# Patient Record
Sex: Female | Born: 1955 | Race: Black or African American | Hispanic: No | Marital: Married | Smoking: Former smoker
Health system: Southern US, Community
[De-identification: ages and names within clinical notes are randomized; demographics above are authoritative.]

## PROBLEM LIST (undated history)

## (undated) DIAGNOSIS — R4702 Dysphasia: Secondary | ICD-10-CM

## (undated) DIAGNOSIS — J449 Chronic obstructive pulmonary disease, unspecified: Secondary | ICD-10-CM

## (undated) DIAGNOSIS — R0609 Other forms of dyspnea: Secondary | ICD-10-CM

## (undated) DIAGNOSIS — M75101 Unspecified rotator cuff tear or rupture of right shoulder, not specified as traumatic: Secondary | ICD-10-CM

## (undated) DIAGNOSIS — J439 Emphysema, unspecified: Secondary | ICD-10-CM

## (undated) DIAGNOSIS — J40 Bronchitis, not specified as acute or chronic: Secondary | ICD-10-CM

## (undated) DIAGNOSIS — Z8673 Personal history of transient ischemic attack (TIA), and cerebral infarction without residual deficits: Secondary | ICD-10-CM

## (undated) DIAGNOSIS — R7303 Prediabetes: Secondary | ICD-10-CM

## (undated) DIAGNOSIS — T8859XA Other complications of anesthesia, initial encounter: Secondary | ICD-10-CM

## (undated) DIAGNOSIS — I10 Essential (primary) hypertension: Secondary | ICD-10-CM

## (undated) DIAGNOSIS — J45909 Unspecified asthma, uncomplicated: Secondary | ICD-10-CM

## (undated) DIAGNOSIS — N950 Postmenopausal bleeding: Secondary | ICD-10-CM

## (undated) DIAGNOSIS — H40003 Preglaucoma, unspecified, bilateral: Secondary | ICD-10-CM

## (undated) DIAGNOSIS — N840 Polyp of corpus uteri: Secondary | ICD-10-CM

## (undated) DIAGNOSIS — I639 Cerebral infarction, unspecified: Secondary | ICD-10-CM

## (undated) DIAGNOSIS — Z973 Presence of spectacles and contact lenses: Secondary | ICD-10-CM

## (undated) DIAGNOSIS — M1990 Unspecified osteoarthritis, unspecified site: Secondary | ICD-10-CM

## (undated) DIAGNOSIS — K219 Gastro-esophageal reflux disease without esophagitis: Secondary | ICD-10-CM

## (undated) HISTORY — DX: Bronchitis, not specified as acute or chronic: J40

## (undated) HISTORY — PX: GALLBLADDER SURGERY: SHX652

## (undated) HISTORY — DX: Emphysema, unspecified: J43.9

## (undated) HISTORY — DX: Chronic obstructive pulmonary disease, unspecified: J44.9

---

## 1978-04-07 HISTORY — PX: DILATION AND CURETTAGE OF UTERUS: SHX78

## 1990-04-07 HISTORY — PX: TUBAL LIGATION: SHX77

## 1994-04-07 HISTORY — PX: CHOLECYSTECTOMY: SHX55

## 1999-01-25 ENCOUNTER — Other Ambulatory Visit: Admission: RE | Admit: 1999-01-25 | Discharge: 1999-01-25 | Payer: Self-pay | Admitting: Gynecology

## 2000-02-26 ENCOUNTER — Ambulatory Visit (HOSPITAL_COMMUNITY): Admission: RE | Admit: 2000-02-26 | Discharge: 2000-02-26 | Payer: Self-pay | Admitting: Family Medicine

## 2000-05-05 ENCOUNTER — Other Ambulatory Visit: Admission: RE | Admit: 2000-05-05 | Discharge: 2000-05-05 | Payer: Self-pay | Admitting: Gynecology

## 2000-10-02 ENCOUNTER — Ambulatory Visit (HOSPITAL_COMMUNITY): Admission: RE | Admit: 2000-10-02 | Discharge: 2000-10-02 | Payer: Self-pay

## 2002-04-14 ENCOUNTER — Other Ambulatory Visit: Admission: RE | Admit: 2002-04-14 | Discharge: 2002-04-14 | Payer: Self-pay | Admitting: Gynecology

## 2004-03-21 ENCOUNTER — Other Ambulatory Visit: Admission: RE | Admit: 2004-03-21 | Discharge: 2004-03-21 | Payer: Self-pay | Admitting: Obstetrics and Gynecology

## 2005-03-26 ENCOUNTER — Other Ambulatory Visit: Admission: RE | Admit: 2005-03-26 | Discharge: 2005-03-26 | Payer: Self-pay | Admitting: Gynecology

## 2006-04-10 ENCOUNTER — Other Ambulatory Visit: Admission: RE | Admit: 2006-04-10 | Discharge: 2006-04-10 | Payer: Self-pay | Admitting: Gynecology

## 2007-01-15 ENCOUNTER — Encounter: Admission: RE | Admit: 2007-01-15 | Discharge: 2007-01-15 | Payer: Self-pay | Admitting: Emergency Medicine

## 2008-02-08 ENCOUNTER — Ambulatory Visit: Payer: Self-pay | Admitting: Family Medicine

## 2008-02-08 DIAGNOSIS — I10 Essential (primary) hypertension: Secondary | ICD-10-CM | POA: Insufficient documentation

## 2008-02-08 DIAGNOSIS — J309 Allergic rhinitis, unspecified: Secondary | ICD-10-CM | POA: Insufficient documentation

## 2008-02-22 ENCOUNTER — Ambulatory Visit: Payer: Self-pay | Admitting: Family Medicine

## 2008-02-22 ENCOUNTER — Ambulatory Visit (HOSPITAL_COMMUNITY): Admission: RE | Admit: 2008-02-22 | Discharge: 2008-02-22 | Payer: Self-pay | Admitting: Family Medicine

## 2008-02-22 LAB — CONVERTED CEMR LAB
ALT: 13 units/L (ref 0–35)
Albumin: 4.5 g/dL (ref 3.5–5.2)
Alkaline Phosphatase: 109 units/L (ref 39–117)
BUN: 13 mg/dL (ref 6–23)
Basophils Relative: 1 % (ref 0–1)
Cholesterol: 177 mg/dL (ref 0–200)
Eosinophils Absolute: 0.4 10*3/uL (ref 0.0–0.7)
Eosinophils Relative: 6 % — ABNORMAL HIGH (ref 0–5)
HCT: 40.4 % (ref 36.0–46.0)
HDL: 55 mg/dL (ref >39)
Hemoglobin: 13.1 g/dL (ref 12.0–15.0)
LDL Cholesterol: 105 mg/dL — ABNORMAL HIGH (ref 0–99)
Lymphocytes Relative: 41 % (ref 12–46)
MCHC: 32.4 g/dL (ref 30.0–36.0)
Monocytes Absolute: 0.5 10*3/uL (ref 0.1–1.0)
Platelets: 343 10*3/uL (ref 150–400)
Potassium: 4.5 meq/L (ref 3.5–5.3)
RBC: 4.82 M/uL (ref 3.87–5.11)
Triglycerides: 87 mg/dL (ref <150)
WBC: 6.1 10*3/uL (ref 4.0–10.5)

## 2008-02-24 ENCOUNTER — Encounter (INDEPENDENT_AMBULATORY_CARE_PROVIDER_SITE_OTHER): Payer: Self-pay | Admitting: Family Medicine

## 2008-03-27 ENCOUNTER — Ambulatory Visit: Payer: Self-pay | Admitting: Family Medicine

## 2008-03-27 LAB — CONVERTED CEMR LAB
Glucose, Urine, Semiquant: NEGATIVE
Nitrite: NEGATIVE
Specific Gravity, Urine: <1.005
WBC Urine, dipstick: NEGATIVE

## 2008-04-13 ENCOUNTER — Encounter (INDEPENDENT_AMBULATORY_CARE_PROVIDER_SITE_OTHER): Payer: Self-pay | Admitting: Family Medicine

## 2008-11-16 ENCOUNTER — Ambulatory Visit: Payer: Self-pay | Admitting: Physician Assistant

## 2008-11-16 LAB — CONVERTED CEMR LAB
CO2: 25 meq/L (ref 19–32)
Chloride: 107 meq/L (ref 96–112)
Sodium: 145 meq/L (ref 135–145)

## 2008-11-17 ENCOUNTER — Encounter: Payer: Self-pay | Admitting: Physician Assistant

## 2008-11-22 ENCOUNTER — Ambulatory Visit: Payer: Self-pay | Admitting: Physician Assistant

## 2008-11-29 ENCOUNTER — Telehealth: Payer: Self-pay | Admitting: Physician Assistant

## 2008-11-29 ENCOUNTER — Encounter: Payer: Self-pay | Admitting: Physician Assistant

## 2008-11-29 DIAGNOSIS — H40009 Preglaucoma, unspecified, unspecified eye: Secondary | ICD-10-CM | POA: Insufficient documentation

## 2009-10-04 ENCOUNTER — Encounter: Payer: Self-pay | Admitting: Physician Assistant

## 2010-05-07 NOTE — Letter (Signed)
Summary: MAILED RECORDS TO [facility] @ [facility]  MAILED RECORDS TO [facility] @ [facility]   Imported By: Arta Bruce 10/04/2009 10:21:31  _____________________________________________________________________  External Attachment:    Type:   Image     Comment:   External Document

## 2011-03-12 ENCOUNTER — Other Ambulatory Visit (HOSPITAL_COMMUNITY)
Admission: RE | Admit: 2011-03-12 | Discharge: 2011-03-12 | Disposition: A | Discharge status: Home / Self Care | Payer: BC Managed Care – PPO | Source: Ambulatory Visit | Attending: Family Medicine | Admitting: Family Medicine

## 2011-03-12 ENCOUNTER — Other Ambulatory Visit: Payer: Self-pay | Admitting: Family Medicine

## 2011-03-12 DIAGNOSIS — Z124 Encounter for screening for malignant neoplasm of cervix: Secondary | ICD-10-CM | POA: Insufficient documentation

## 2011-03-12 DIAGNOSIS — Z1159 Encounter for screening for other viral diseases: Secondary | ICD-10-CM | POA: Insufficient documentation

## 2013-11-18 ENCOUNTER — Other Ambulatory Visit (HOSPITAL_COMMUNITY)
Admission: RE | Admit: 2013-11-18 | Discharge: 2013-11-18 | Disposition: A | Discharge status: Home / Self Care | Payer: BC Managed Care – PPO | Source: Ambulatory Visit | Attending: Family Medicine | Admitting: Family Medicine

## 2013-11-18 ENCOUNTER — Other Ambulatory Visit: Payer: Self-pay | Admitting: Family Medicine

## 2013-11-18 DIAGNOSIS — Z124 Encounter for screening for malignant neoplasm of cervix: Secondary | ICD-10-CM | POA: Insufficient documentation

## 2013-11-22 LAB — CYTOLOGY - PAP

## 2014-04-17 ENCOUNTER — Emergency Department (HOSPITAL_COMMUNITY)
Admission: EM | Admit: 2014-04-17 | Discharge: 2014-04-17 | Disposition: A | Discharge status: Home / Self Care | Payer: BC Managed Care – PPO | Attending: Emergency Medicine | Admitting: Emergency Medicine

## 2014-04-17 ENCOUNTER — Emergency Department (HOSPITAL_COMMUNITY): Payer: BC Managed Care – PPO

## 2014-04-17 ENCOUNTER — Encounter (HOSPITAL_COMMUNITY): Payer: Self-pay | Admitting: Emergency Medicine

## 2014-04-17 DIAGNOSIS — R079 Chest pain, unspecified: Secondary | ICD-10-CM | POA: Diagnosis present

## 2014-04-17 DIAGNOSIS — I10 Essential (primary) hypertension: Secondary | ICD-10-CM | POA: Diagnosis not present

## 2014-04-17 DIAGNOSIS — Z8673 Personal history of transient ischemic attack (TIA), and cerebral infarction without residual deficits: Secondary | ICD-10-CM | POA: Diagnosis not present

## 2014-04-17 DIAGNOSIS — R002 Palpitations: Secondary | ICD-10-CM

## 2014-04-17 DIAGNOSIS — R0602 Shortness of breath: Secondary | ICD-10-CM | POA: Insufficient documentation

## 2014-04-17 HISTORY — DX: Cerebral infarction, unspecified: I63.9

## 2014-04-17 HISTORY — DX: Essential (primary) hypertension: I10

## 2014-04-17 LAB — LIPASE, BLOOD: Lipase: 38 U/L (ref 11–59)

## 2014-04-17 LAB — CBC WITH DIFFERENTIAL/PLATELET
BASOS PCT: 1 % (ref 0–1)
Basophils Absolute: 0 10*3/uL (ref 0.0–0.1)
EOS ABS: 0.4 10*3/uL (ref 0.0–0.7)
Eosinophils Relative: 7 % — ABNORMAL HIGH (ref 0–5)
HCT: 36.1 % (ref 36.0–46.0)
HEMOGLOBIN: 11.9 g/dL — AB (ref 12.0–15.0)
Lymphocytes Relative: 40 % (ref 12–46)
Lymphs Abs: 2.6 10*3/uL (ref 0.7–4.0)
MCH: 26.9 pg (ref 26.0–34.0)
MCHC: 33 g/dL (ref 30.0–36.0)
MCV: 81.5 fL (ref 78.0–100.0)
MONO ABS: 0.5 10*3/uL (ref 0.1–1.0)
Monocytes Relative: 8 % (ref 3–12)
Neutro Abs: 3 10*3/uL (ref 1.7–7.7)
Neutrophils Relative %: 44 % (ref 43–77)
Platelets: 258 10*3/uL (ref 150–400)
RBC: 4.43 MIL/uL (ref 3.87–5.11)
RDW: 15.2 % (ref 11.5–15.5)
WBC: 6.6 10*3/uL (ref 4.0–10.5)

## 2014-04-17 LAB — COMPREHENSIVE METABOLIC PANEL
ALK PHOS: 125 U/L — AB (ref 39–117)
ALT: 11 U/L (ref 0–35)
ANION GAP: 6 (ref 5–15)
AST: 15 U/L (ref 0–37)
Albumin: 3.4 g/dL — ABNORMAL LOW (ref 3.5–5.2)
BILIRUBIN TOTAL: 0.1 mg/dL — AB (ref 0.3–1.2)
BUN: 13 mg/dL (ref 6–23)
CALCIUM: 9.3 mg/dL (ref 8.4–10.5)
CHLORIDE: 105 meq/L (ref 96–112)
CO2: 28 mmol/L (ref 19–32)
Creatinine, Ser: 0.65 mg/dL (ref 0.50–1.10)
GFR calc non Af Amer: >90 mL/min (ref >90)
GLUCOSE: 119 mg/dL — AB (ref 70–99)
Potassium: 3.5 mmol/L (ref 3.5–5.1)
Sodium: 139 mmol/L (ref 135–145)
Total Protein: 6.5 g/dL (ref 6.0–8.3)

## 2014-04-17 LAB — TROPONIN I

## 2014-04-17 MED ORDER — SODIUM CHLORIDE 0.9 % IV SOLN
INTRAVENOUS | Status: DC
  Administered 2014-04-17: 20 mL/h via INTRAVENOUS

## 2014-04-17 NOTE — ED Notes (Signed)
Pt arrives from home with c/o [facility], awoke with "heart fluttering," hx of same in previous years, is not currently on any meds. EKG shows sinus with PVCs. 324 MG aspirin on board, $L of O2 applied. No nausea, diaphoresis. Hypotensive, 105/63. Chest tightness, 4/10, mid sternal non radiating.

## 2014-04-17 NOTE — Discharge Instructions (Signed)

## 2014-04-17 NOTE — ED Provider Notes (Signed)
CSN: [id number]     Arrival date & time 04/17/14  0303 History  This chart was scribed for Leota Jacobsen, MD by Rayfield Citizen, ED Scribe. This patient was seen in room A03C/A03C and the patient's care was started at 3:28 AM.    Chief Complaint  Patient presents with  . Chest Pain   Patient is a 59 y.o. female presenting with chest pain. The history is provided by the patient. No language interpreter was used.  Chest Pain Associated symptoms: palpitations and shortness of breath      HPI Comments: Samantha [name] is a 59 y.o. female with past medical history of HTN, stroke who presents to the Emergency Department complaining of "heart fluttering" sensations with slight chest pressure tonight while falling asleep. She describes the fluttering as her heart beating "quickly and irregularly." She also reports mild SOB. Her symptoms lasted about an hour and are resolved at present. She took an aspirin at symptom onset and another about 30 minutes after that; her symptoms did not improve. Patient states she was feeling well before symptom onset; she explains she had a glass of white wine earlier tonight (she reports that she does not drink often, estimated 1x per week).   She reports one prior experience with similar symptoms but not to this degree of severity; she believes this prior incident was "a fib" related.   Past Medical History  Diagnosis Date  . Hypertension   . Stroke    History reviewed. No pertinent past surgical history. No family history on file. History  Substance Use Topics  . Smoking status: Never Smoker   . Smokeless tobacco: Not on file  . Alcohol Use: Yes   OB History    No data available     Review of Systems  Respiratory: Positive for shortness of breath.   Cardiovascular: Positive for chest pain and palpitations.  All other systems reviewed and are negative.  Allergies  Review of patient's allergies indicates no known allergies.  Home Medications   Prior  to Admission medications   Not on File   SpO2 100% Physical Exam  Constitutional: She is oriented to person, place, and time. She appears well-developed and well-nourished.  Non-toxic appearance. No distress.  HENT:  Head: Normocephalic and atraumatic.  Eyes: Conjunctivae, EOM and lids are normal. Pupils are equal, round, and reactive to light.  Neck: Normal range of motion. Neck supple. No tracheal deviation present. No thyroid mass present.  Cardiovascular: Normal rate, regular rhythm and normal heart sounds.  Exam reveals no gallop.   No murmur heard. Pulmonary/Chest: Effort normal and breath sounds normal. No stridor. No respiratory distress. She has no decreased breath sounds. She has no wheezes. She has no rhonchi. She has no rales.  Abdominal: Soft. Normal appearance and bowel sounds are normal. She exhibits no distension. There is no tenderness. There is no rebound and no CVA tenderness.  Musculoskeletal: Normal range of motion. She exhibits no edema or tenderness.  Neurological: She is alert and oriented to person, place, and time. She has normal strength. No cranial nerve deficit or sensory deficit. GCS eye subscore is 4. GCS verbal subscore is 5. GCS motor subscore is 6.  Skin: Skin is warm and dry. No abrasion and no rash noted.  Psychiatric: She has a normal mood and affect. Her speech is normal and behavior is normal.  Nursing note and vitals reviewed.   ED Course  Procedures   DIAGNOSTIC STUDIES: Oxygen Saturation is 100% on  RA, normal by my interpretation.    COORDINATION OF CARE: 3:35 AM Discussed treatment plan with pt at bedside and pt agreed to plan.   Labs Review Labs Reviewed  TROPONIN I  CBC WITH DIFFERENTIAL  COMPREHENSIVE METABOLIC PANEL  LIPASE, BLOOD    Imaging Review No results found.   EKG Interpretation   Date/Time:  Monday April 17 2014 03:06:49 EST Ventricular Rate:  78 PR Interval:  164 QRS Duration: 82 QT Interval:  374 QTC  Calculation: 426 R Axis:   61 Text Interpretation:  Sinus rhythm with marked sinus arrhythmia Otherwise  normal ECG Confirmed by Keaten Mashek  MD, Raizel Wesolowski ([id number]) on 04/17/2014 5:32:02 AM      MDM   Final diagnoses:  Chest pain    I personally performed the services described in this documentation, which was scribed in my presence. The recorded information has been reviewed and is accurate.  Patient's symptoms likely from having a transient episode of atrial fibrillation from her alcohol use. She does have a history of atrial fibrillation in the past. She has no signs of that currently. She is in sinus rhythm. She has been instructed to abstain from alcohol use.     Leota Jacobsen, MD 04/17/14 [phone]

## 2016-12-01 ENCOUNTER — Other Ambulatory Visit (HOSPITAL_COMMUNITY)
Admission: RE | Admit: 2016-12-01 | Discharge: 2016-12-01 | Disposition: A | Discharge status: Home / Self Care | Payer: BC Managed Care – PPO | Source: Ambulatory Visit | Attending: Family Medicine | Admitting: Family Medicine

## 2016-12-01 ENCOUNTER — Other Ambulatory Visit: Payer: Self-pay | Admitting: Family Medicine

## 2016-12-01 DIAGNOSIS — Z01411 Encounter for gynecological examination (general) (routine) with abnormal findings: Secondary | ICD-10-CM | POA: Insufficient documentation

## 2016-12-03 LAB — CYTOLOGY - PAP
DIAGNOSIS: NEGATIVE
HPV: NOT DETECTED

## 2017-11-05 ENCOUNTER — Encounter (HOSPITAL_COMMUNITY): Payer: Self-pay | Admitting: Emergency Medicine

## 2017-11-05 ENCOUNTER — Emergency Department (HOSPITAL_COMMUNITY)
Admission: EM | Admit: 2017-11-05 | Discharge: 2017-11-05 | Disposition: A | Discharge status: Home / Self Care | Payer: BLUE CROSS/BLUE SHIELD | Attending: Emergency Medicine | Admitting: Emergency Medicine

## 2017-11-05 ENCOUNTER — Emergency Department (HOSPITAL_BASED_OUTPATIENT_CLINIC_OR_DEPARTMENT_OTHER): Payer: BLUE CROSS/BLUE SHIELD

## 2017-11-05 ENCOUNTER — Other Ambulatory Visit: Payer: Self-pay

## 2017-11-05 ENCOUNTER — Emergency Department (HOSPITAL_COMMUNITY): Payer: BLUE CROSS/BLUE SHIELD

## 2017-11-05 DIAGNOSIS — R600 Localized edema: Secondary | ICD-10-CM | POA: Insufficient documentation

## 2017-11-05 DIAGNOSIS — I10 Essential (primary) hypertension: Secondary | ICD-10-CM | POA: Insufficient documentation

## 2017-11-05 DIAGNOSIS — M7989 Other specified soft tissue disorders: Secondary | ICD-10-CM | POA: Diagnosis not present

## 2017-11-05 DIAGNOSIS — Z7982 Long term (current) use of aspirin: Secondary | ICD-10-CM | POA: Insufficient documentation

## 2017-11-05 DIAGNOSIS — R2242 Localized swelling, mass and lump, left lower limb: Secondary | ICD-10-CM | POA: Diagnosis present

## 2017-11-05 DIAGNOSIS — Z79899 Other long term (current) drug therapy: Secondary | ICD-10-CM | POA: Diagnosis not present

## 2017-11-05 DIAGNOSIS — M79609 Pain in unspecified limb: Secondary | ICD-10-CM

## 2017-11-05 DIAGNOSIS — R609 Edema, unspecified: Secondary | ICD-10-CM

## 2017-11-05 LAB — COMPREHENSIVE METABOLIC PANEL
ALT: 13 U/L (ref 0–44)
AST: 17 U/L (ref 15–41)
Albumin: 3.9 g/dL (ref 3.5–5.0)
Alkaline Phosphatase: 108 U/L (ref 38–126)
Anion gap: 9 (ref 5–15)
BUN: 9 mg/dL (ref 8–23)
CO2: 23 mmol/L (ref 22–32)
Calcium: 9.5 mg/dL (ref 8.9–10.3)
Chloride: 109 mmol/L (ref 98–111)
Creatinine, Ser: 0.79 mg/dL (ref 0.44–1.00)
GFR calc Af Amer: >60 mL/min (ref >60)
GFR calc non Af Amer: >60 mL/min (ref >60)
Glucose, Bld: 99 mg/dL (ref 70–99)
Potassium: 4 mmol/L (ref 3.5–5.1)
Sodium: 141 mmol/L (ref 135–145)
Total Bilirubin: 0.4 mg/dL (ref 0.3–1.2)
Total Protein: 7.4 g/dL (ref 6.5–8.1)

## 2017-11-05 LAB — CBC WITH DIFFERENTIAL/PLATELET
Abs Immature Granulocytes: 0 10*3/uL (ref 0.0–0.1)
Basophils Absolute: 0.1 10*3/uL (ref 0.0–0.1)
Basophils Relative: 1 %
Eosinophils Absolute: 0.4 10*3/uL (ref 0.0–0.7)
Eosinophils Relative: 5 %
HCT: 43.5 % (ref 36.0–46.0)
Hemoglobin: 13.3 g/dL (ref 12.0–15.0)
Immature Granulocytes: 0 %
Lymphocytes Relative: 38 %
Lymphs Abs: 2.7 10*3/uL (ref 0.7–4.0)
MCH: 27.1 pg (ref 26.0–34.0)
MCHC: 30.6 g/dL (ref 30.0–36.0)
MCV: 88.8 fL (ref 78.0–100.0)
Monocytes Absolute: 0.7 10*3/uL (ref 0.1–1.0)
Monocytes Relative: 10 %
Neutro Abs: 3.2 10*3/uL (ref 1.7–7.7)
Neutrophils Relative %: 46 %
Platelets: 319 10*3/uL (ref 150–400)
RBC: 4.9 MIL/uL (ref 3.87–5.11)
RDW: 14.5 % (ref 11.5–15.5)
WBC: 7.1 10*3/uL (ref 4.0–10.5)

## 2017-11-05 LAB — BRAIN NATRIURETIC PEPTIDE: B Natriuretic Peptide: 15.8 pg/mL (ref 0.0–100.0)

## 2017-11-05 MED ORDER — FUROSEMIDE 20 MG PO TABS
40.0000 mg | ORAL_TABLET | Freq: Once | ORAL | Status: AC
  Administered 2017-11-05: 40 mg via ORAL
  Filled 2017-11-05: qty 2

## 2017-11-05 NOTE — ED Provider Notes (Signed)
Patient placed in Quick Look pathway, seen and evaluated   Chief Complaint: Leg swelling  HPI:   Patient presents with bilateral leg swelling for the past week.  She reports initially she was not having any pain, but also has had some pain in her left, however both continue to swell intermittently.  She denies any chest pain or shortness of breath.  She reports recently having a trip to [place] about a week and a half ago.  She denies any surgeries or other immobilizations, known cancer, exogenous estrogen use, history of blood clots.  Patient takes lisinopril-HCTZ.  ROS: + Leg swelling and leg pain, -chest pain, shortness of breath  Physical Exam:   Gen: No distress  Neuro: Awake and Alert  Skin: Warm    Focused Exam: Very mild edema in bilateral feet, no color change to skin, some varicosities noticed in bilateral lower extremities, tenderness to left calf and popliteal space   Initiation of care has begun. The patient has been counseled on the process, plan, and necessity for staying for the completion/evaluation, and the remainder of the medical screening examination    Frederica Kuster, PA-C 11/05/17 [place], [place], DO 11/05/17 1535

## 2017-11-05 NOTE — Progress Notes (Signed)
Left lower extremity venous duplex completed. There is no evidence of DVT or Baker's cyst. Samantha [name] 11/05/2017 4;32 PM

## 2017-11-05 NOTE — ED Triage Notes (Signed)
Pt here for bilateral leg swelling that has been on going for weeks but now is having bilateral pain in both lower extremities.

## 2017-11-05 NOTE — ED Notes (Signed)
Patient transported to ultrasound.

## 2017-11-05 NOTE — ED Provider Notes (Signed)
Emergency Department Provider Note   I have reviewed the triage vital signs and the nursing notes.   HISTORY  Chief Complaint Leg Swelling   HPI Samantha [name] is a 62 y.o. female with only medical history of BN high blood pressure for which she is on lisinopril/HCTZ here for bilateral lower extremity edema.  Patient states is been going on for approximately 10 or 12 days and progressively worsening both of her legs.  She states the left is worse than the right.  She is had issues like this before but this just started back again.  Yesterday the swelling seemed to be a lot worse and she had redness that was going up her leg and she had skin infections in the past and check for blood clots in the past that she went to come here to be evaluated.  Chest pain, shortness of breath or cough.  No dizziness.  No history of heart failure.  On review of systems it does seem that she stopped taking her medication for a few days just prior to this starting.  States she is been urinating normally since then. No other associated or modifying symptoms.    Past Medical History:  Diagnosis Date  . Hypertension   . Stroke [facility])     Patient Active Problem List   Diagnosis Date Noted  . 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 ESSENTIAL HYPERTENSION, BENIGN 02/08/2008  . ALLERGIC RHINITIS 02/08/2008    History reviewed. No pertinent surgical history.  Current Outpatient Rx  . Order #: [id number] Class: Historical Med  . Order #: [id number] Class: Historical Med  . Order #: [id number] Class: Historical Med  . Order #: [id number] Class: Historical Med    Allergies Patient has no known allergies.  No family history on file.  Social History Social History   Tobacco Use  . Smoking status: Never Smoker  Substance Use Topics  . Alcohol use: Yes  . Drug use: No    Review of Systems  All other systems negative except as documented in the HPI. All pertinent positives and negatives as reviewed in the  HPI. ____________________________________________   PHYSICAL EXAM:  VITAL SIGNS: ED Triage Vitals  Enc Vitals Group     BP 11/05/17 1331 136/83     Pulse Rate 11/05/17 1331 (!) 119     Resp 11/05/17 1331 16     Temp 11/05/17 1331 98.2 F (36.8 C)     Temp Source 11/05/17 1331 Oral     SpO2 11/05/17 1331 100 %     Weight --      Height --      Head Circumference --      Peak Flow --      Pain Score 11/05/17 1330 5     Pain Loc --      Pain Edu? --      Excl. in [place]? --     Constitutional: Alert and oriented. Well appearing and in no acute distress. Eyes: Conjunctivae are normal. PERRL. EOMI. Head: Atraumatic. Nose: No congestion/rhinnorhea. Mouth/Throat: Mucous membranes are moist.  Oropharynx non-erythematous. Neck: No stridor.  No meningeal signs.   Cardiovascular: Normal rate, regular rhythm. Good peripheral circulation. Grossly normal heart sounds.   Respiratory: Normal respiratory effort.  No retractions. Lungs CTAB. Gastrointestinal: Soft and nontender. No distention.  Musculoskeletal: Left greater than right lower 70 edema.  Bronzing skin and evidence of chronic venous stasis.  Normal and equal DP and PT pulses bilaterally.. No gross deformities of extremities. Neurologic:  Normal speech and language. No gross focal neurologic deficits are appreciated.  Skin:  Skin is warm, dry and intact. No rash noted.   ____________________________________________   LABS (all labs ordered are listed, but only abnormal results are displayed)  Labs Reviewed  COMPREHENSIVE METABOLIC PANEL  CBC WITH DIFFERENTIAL/PLATELET  BRAIN NATRIURETIC PEPTIDE   ____________________________________________  EKG   EKG Interpretation  Date/Time:  Thursday November 05 2017 15:40:52 EDT Ventricular Rate:  84 PR Interval:    QRS Duration: 78 QT Interval:  360 QTC Calculation: 426 R Axis:   30 Text Interpretation:  Sinus rhythm Probable left atrial enlargement Low voltage, precordial  leads No significant change since last tracing Confirmed by Merrily Pew [phone]) on 11/05/2017 4:24:42 PM       ____________________________________________  RADIOLOGY  Dg Chest 2 View  Result Date: 11/05/2017 CLINICAL DATA:  Bilateral lower extremity pain and swelling. EXAM: CHEST - 2 VIEW COMPARISON:  Radiographs of April 17, 2014. FINDINGS: The heart size and mediastinal contours are within normal limits. Both lungs are clear. No pneumothorax or pleural effusion is noted. The visualized skeletal structures are unremarkable. IMPRESSION: No active cardiopulmonary disease. Electronically Signed   By: Marijo Conception, M.D.   On: 11/05/2017 16:08    ____________________________________________   PROCEDURES  Procedure(s) performed:   Procedures   ____________________________________________   INITIAL IMPRESSION / ASSESSMENT AND PLAN / ED COURSE  I suspect the missed doses of the diuretic is what caused the patient's symptoms.  No evidence of DVT.  No evidence of arterial occlusion.  No evidence of acute congestive heart failure.  Will give a dose of Lasix here, inform her to keep her feet elevated and not miss any doses of her medication and follow-up with her doctor if not improving.     Pertinent labs & imaging results that were available during my care of the patient were reviewed by me and considered in my medical decision making (see chart for details).  ____________________________________________  FINAL CLINICAL IMPRESSION(S) / ED DIAGNOSES  Final diagnoses:  Peripheral edema     MEDICATIONS GIVEN DURING THIS VISIT:  Medications  furosemide (LASIX) tablet 40 mg (has no administration in time range)     NEW OUTPATIENT MEDICATIONS STARTED DURING THIS VISIT:  New Prescriptions   No medications on file    Note:  This note was prepared with assistance of Dragon voice recognition software. Occasional wrong-word or sound-a-like substitutions may have occurred  due to the inherent limitations of voice recognition software.   Merrily Pew, MD 11/05/17 [phone]

## 2019-04-08 DIAGNOSIS — N950 Postmenopausal bleeding: Secondary | ICD-10-CM

## 2019-04-08 HISTORY — DX: Postmenopausal bleeding: N95.0

## 2019-08-30 ENCOUNTER — Other Ambulatory Visit (HOSPITAL_COMMUNITY)
Admission: RE | Admit: 2019-08-30 | Discharge: 2019-08-30 | Disposition: A | Discharge status: Home / Self Care | Payer: 59 | Source: Ambulatory Visit | Attending: Family Medicine | Admitting: Family Medicine

## 2019-08-30 ENCOUNTER — Other Ambulatory Visit: Payer: Self-pay | Admitting: Family Medicine

## 2019-08-30 DIAGNOSIS — Z01411 Encounter for gynecological examination (general) (routine) with abnormal findings: Secondary | ICD-10-CM | POA: Diagnosis present

## 2019-08-31 LAB — CYTOLOGY - PAP: Diagnosis: NEGATIVE

## 2020-03-13 DIAGNOSIS — Z20822 Contact with and (suspected) exposure to covid-19: Secondary | ICD-10-CM | POA: Diagnosis not present

## 2020-03-15 DIAGNOSIS — Z6841 Body Mass Index (BMI) 40.0 and over, adult: Secondary | ICD-10-CM | POA: Diagnosis not present

## 2020-03-15 DIAGNOSIS — J441 Chronic obstructive pulmonary disease with (acute) exacerbation: Secondary | ICD-10-CM | POA: Diagnosis not present

## 2020-03-15 DIAGNOSIS — J4520 Mild intermittent asthma, uncomplicated: Secondary | ICD-10-CM | POA: Diagnosis not present

## 2020-04-18 DIAGNOSIS — J441 Chronic obstructive pulmonary disease with (acute) exacerbation: Secondary | ICD-10-CM | POA: Diagnosis not present

## 2020-04-18 DIAGNOSIS — R051 Acute cough: Secondary | ICD-10-CM | POA: Diagnosis not present

## 2020-04-19 DIAGNOSIS — R051 Acute cough: Secondary | ICD-10-CM | POA: Diagnosis not present

## 2020-04-24 ENCOUNTER — Ambulatory Visit: Payer: Self-pay | Admitting: Obstetrics and Gynecology

## 2020-05-15 ENCOUNTER — Encounter: Payer: Self-pay | Admitting: Obstetrics & Gynecology

## 2020-05-15 ENCOUNTER — Other Ambulatory Visit: Payer: Self-pay

## 2020-05-15 ENCOUNTER — Ambulatory Visit: Payer: Medicare HMO | Admitting: Obstetrics & Gynecology

## 2020-05-15 VITALS — BP 126/80 | Ht 64.0 in | Wt 263.0 lb

## 2020-05-15 DIAGNOSIS — N950 Postmenopausal bleeding: Secondary | ICD-10-CM

## 2020-05-15 NOTE — Progress Notes (Signed)
    Samantha [name] 27-Mar-1956 [id number]        65 y.o.  [id number] Married  RP: PMB x 1 in December 2021  HPI: PMB x 1 in December 2021, light and accompanied by mild pelvic cramping.  Didn't happen after IC.  Postmenopausal x many years, no HRT.  Having more hot flushes x about 1 year.  Urine/BMs normal.   OB History  Gravida Para Term Preterm AB Living  6 3     2 3   SAB IAB Ectopic Multiple Live Births  1   0        # Outcome Date GA Lbr Len/2nd Weight Sex Delivery Anes PTL Lv  6 Gravida           5 Para           4 Para           3 Para           2 AB           1 SAB             Past medical history,surgical history, problem list, medications, allergies, family history and social history were all reviewed and documented in the EPIC chart.   Directed ROS with pertinent positives and negatives documented in the history of present illness/assessment and plan.  Exam:  Vitals:   05/15/20 1612  BP: 126/80  Weight: 263 lb (119.3 kg)  Height: 5\' 4"  (1.626 m)   General appearance:  Normal  Abdomen: Normal  Gynecologic exam: Vulva normal.  Speculum:  Cervix/Vagina normal.  No blood, no vaginal d/c.  Bimanual exam:  Uterus AV, normal volume, mobile, NT.  No adnexal mass, NT bilaterally.   Assessment/Plan:  65 y.o. L7N3005   1. Postmenopausal bleeding Light postmenopausal bleeding 1 month ago.  Postmenopausal for many years on no hormone replacement therapy.  Normal gynecologic exam today.  We will complete the evaluation with a pelvic ultrasound at follow-up to evaluate the endometrial lining.  Counseling on postmenopausal bleeding done. - US Transvaginal Non-OB; Future  Other orders - albuterol (VENTOLIN HFA) 108 (90 Base) MCG/ACT inhaler - fluticasone furoate-vilanterol (BREO ELLIPTA) 200-25 MCG/INH AEPB  [name] MD, 4:26 PM 05/15/2020

## 2020-05-17 ENCOUNTER — Encounter: Payer: Self-pay | Admitting: Obstetrics & Gynecology

## 2020-05-19 DIAGNOSIS — Z20822 Contact with and (suspected) exposure to covid-19: Secondary | ICD-10-CM | POA: Diagnosis not present

## 2020-05-22 ENCOUNTER — Other Ambulatory Visit: Payer: Self-pay | Admitting: *Deleted

## 2020-05-22 DIAGNOSIS — J209 Acute bronchitis, unspecified: Secondary | ICD-10-CM

## 2020-05-23 ENCOUNTER — Ambulatory Visit (INDEPENDENT_AMBULATORY_CARE_PROVIDER_SITE_OTHER): Payer: Medicare HMO | Admitting: Internal Medicine

## 2020-05-23 ENCOUNTER — Other Ambulatory Visit: Payer: Self-pay

## 2020-05-23 DIAGNOSIS — J440 Chronic obstructive pulmonary disease with acute lower respiratory infection: Secondary | ICD-10-CM | POA: Diagnosis not present

## 2020-05-23 DIAGNOSIS — J209 Acute bronchitis, unspecified: Secondary | ICD-10-CM | POA: Diagnosis not present

## 2020-05-23 LAB — PULMONARY FUNCTION TEST
DL/VA % pred: 133 %
DL/VA: 5.57 ml/min/mmHg/L
DLCO cor % pred: 96 %
DLCO cor: 19.12 ml/min/mmHg
DLCO unc % pred: 96 %
DLCO unc: 19.12 ml/min/mmHg
FEF 25-75 Post: 1.59 L/s
FEF 25-75 Pre: 1.34 L/s
FEF2575-%Change-Post: 18 %
FEF2575-%Pred-Post: 84 %
FEF2575-%Pred-Pre: 71 %
FEV1-%Change-Post: 1 %
FEV1-%Pred-Post: 85 %
FEV1-%Pred-Pre: 84 %
FEV1-Post: 1.69 L
FEV1-Pre: 1.67 L
FEV1FVC-%Change-Post: 7 %
FEV1FVC-%Pred-Pre: 99 %
FEV6-%Change-Post: -4 %
FEV6-%Pred-Post: 83 %
FEV6-%Pred-Pre: 87 %
FEV6-Post: 2.02 L
FEV6-Pre: 2.12 L
FEV6FVC-%Change-Post: 0 %
FEV6FVC-%Pred-Post: 103 %
FEV6FVC-%Pred-Pre: 103 %
FVC-%Change-Post: -5 %
FVC-%Pred-Post: 80 %
FVC-%Pred-Pre: 84 %
FVC-Post: 2.02 L
FVC-Pre: 2.14 L
Post FEV1/FVC ratio: 84 %
Post FEV6/FVC ratio: 100 %
Pre FEV1/FVC ratio: 78 %
Pre FEV6/FVC Ratio: 99 %
RV % pred: 96 %
RV: 2.02 L
TLC % pred: 80 %
TLC: 4.09 L

## 2020-05-23 NOTE — Progress Notes (Signed)
Full PFT performed today. °

## 2020-05-29 ENCOUNTER — Telehealth: Payer: Self-pay | Admitting: Internal Medicine

## 2020-05-29 DIAGNOSIS — R0602 Shortness of breath: Secondary | ICD-10-CM | POA: Diagnosis not present

## 2020-05-29 DIAGNOSIS — R942 Abnormal results of pulmonary function studies: Secondary | ICD-10-CM | POA: Diagnosis not present

## 2020-05-29 DIAGNOSIS — R791 Abnormal coagulation profile: Secondary | ICD-10-CM | POA: Diagnosis not present

## 2020-05-29 NOTE — Telephone Encounter (Signed)
I have called and spoke with pt and she is aware that she will need to call her PCP, Dr. Stephanie Acre and get the results from them.  Nothing further is needed.

## 2020-05-29 NOTE — Telephone Encounter (Signed)
This PFT was ordered by Dr Stephanie Acre, her PCP. The study report has been sent to Dr Stephanie Acre, so patient should contact her for results and interpretation.

## 2020-05-29 NOTE — Telephone Encounter (Signed)
CY pt is calling to get the results of her PFT study that was done on 05/23/20.  Please advise. Thanks

## 2020-05-30 ENCOUNTER — Other Ambulatory Visit: Payer: Self-pay | Admitting: Family Medicine

## 2020-05-30 DIAGNOSIS — R7989 Other specified abnormal findings of blood chemistry: Secondary | ICD-10-CM

## 2020-05-31 ENCOUNTER — Ambulatory Visit
Admission: RE | Admit: 2020-05-31 | Discharge: 2020-05-31 | Disposition: A | Discharge status: Home / Self Care | Payer: Medicare HMO | Source: Ambulatory Visit | Attending: Family Medicine | Admitting: Family Medicine

## 2020-05-31 DIAGNOSIS — R918 Other nonspecific abnormal finding of lung field: Secondary | ICD-10-CM | POA: Diagnosis not present

## 2020-05-31 DIAGNOSIS — R7989 Other specified abnormal findings of blood chemistry: Secondary | ICD-10-CM

## 2020-05-31 DIAGNOSIS — J479 Bronchiectasis, uncomplicated: Secondary | ICD-10-CM | POA: Diagnosis not present

## 2020-05-31 DIAGNOSIS — Z9049 Acquired absence of other specified parts of digestive tract: Secondary | ICD-10-CM | POA: Diagnosis not present

## 2020-05-31 MED ORDER — IOPAMIDOL (ISOVUE-370) INJECTION 76%
75.0000 mL | Freq: Once | INTRAVENOUS | Status: AC | PRN
  Administered 2020-05-31: 75 mL via INTRAVENOUS

## 2020-06-01 DIAGNOSIS — J208 Acute bronchitis due to other specified organisms: Secondary | ICD-10-CM | POA: Diagnosis not present

## 2020-06-18 ENCOUNTER — Ambulatory Visit: Payer: Medicare HMO | Admitting: Pulmonary Disease

## 2020-06-18 ENCOUNTER — Other Ambulatory Visit: Payer: Self-pay

## 2020-06-18 ENCOUNTER — Encounter: Payer: Self-pay | Admitting: Pulmonary Disease

## 2020-06-18 VITALS — BP 138/84 | HR 109 | Temp 97.9°F | Ht 64.0 in | Wt 269.0 lb

## 2020-06-18 DIAGNOSIS — J440 Chronic obstructive pulmonary disease with acute lower respiratory infection: Secondary | ICD-10-CM | POA: Diagnosis not present

## 2020-06-18 DIAGNOSIS — J209 Acute bronchitis, unspecified: Secondary | ICD-10-CM | POA: Diagnosis not present

## 2020-06-18 DIAGNOSIS — J40 Bronchitis, not specified as acute or chronic: Secondary | ICD-10-CM

## 2020-06-18 HISTORY — DX: Bronchitis, not specified as acute or chronic: J40

## 2020-06-18 NOTE — Progress Notes (Signed)
Samantha [name]    [id number]    1956-02-12  Primary Care Physician:Wolters, Ivin Booty, MD  Referring Physician: Jonathon Jordan, MD [address] [place],   [id number]  Chief complaint:   Patient being seen for cough and shortness of breath  HPI:  Patient being seen for cough and shortness of breath Had bronchitis around Thanksgiving This protracted into around Christmas time Multiple courses of antibiotics and steroids  Had PFT performed showing mild obstructive disease  Past history of asthma but she felt she outgrew it  She did have albuterol-has only been needing albuterol less than once a week Symptoms are much better Still admits to cough, clear phlegm, no feelings of shortness of breath with mild exertion Denies any chest pain or chest discomfort  She does have occasional wheezing  No pets No recent travel  Reformed smoker Half a pack a day  She was prescribed Breo and albuterol Has not been using Breo and symptoms continue to improve at present  Outpatient Encounter Medications as of 06/18/2020  Medication Sig  . albuterol (VENTOLIN HFA) 108 (90 Base) MCG/ACT inhaler   . aspirin EC 81 MG tablet Take 81 mg by mouth daily.  . cetirizine (ZYRTEC) 10 MG tablet Take 10 mg by mouth daily.  Marland Kitchen lisinopril-hydrochlorothiazide (PRINZIDE,ZESTORETIC) 20-12.5 MG tablet Take 1 tablet by mouth daily.  . celecoxib (CELEBREX) 200 MG capsule Take 200 mg by mouth daily as needed for mild pain. (Patient not taking: Reported on 06/18/2020)  . fluticasone furoate-vilanterol (BREO ELLIPTA) 200-25 MCG/INH AEPB  (Patient not taking: Reported on 06/18/2020)   No facility-administered encounter medications on file as of 06/18/2020.    Allergies as of 06/18/2020 - Review Complete 06/18/2020  Allergen Reaction Noted  . Fexofenadine Itching 06/01/2018  . Flonase [fluticasone] Other (See Comments) 04/18/2020  . Other Other (See Comments) 04/18/2020  .  Singulair [montelukast]  05/15/2020    Past Medical History:  Diagnosis Date  . COPD (chronic obstructive pulmonary disease) ([place])   . Hypertension   . Hypertension   . Stroke [facility])     Past Surgical History:  Procedure Laterality Date  . GALLBLADDER SURGERY      Family History  Problem Relation Age of Onset  . Hypertension Mother   . Anuerysm Mother     Social History   Socioeconomic History  . Marital status: Married    Spouse name: Not on file  . Number of children: Not on file  . Years of education: Not on file  . Highest education level: Not on file  Occupational History  . Not on file  Tobacco Use  . Smoking status: Former Smoker    Quit date: 04/14/2020    Years since quitting: 0.1  . Smokeless tobacco: Never Used  Substance and Sexual Activity  . Alcohol use: Not Currently  . Drug use: No  . Sexual activity: Yes    Birth control/protection: None  Other Topics Concern  . Not on file  Social History Narrative  . Not on file   Social Determinants of Health   Financial Resource Strain: Not on file  Food Insecurity: Not on file  Transportation Needs: Not on file  Physical Activity: Not on file  Stress: Not on file  Social Connections: Not on file  Intimate Partner Violence: Not on file    Review of Systems  Respiratory: Positive for cough and wheezing. Negative for chest tightness and shortness of breath.  Psychiatric/Behavioral: Negative for sleep disturbance.    Vitals:   06/18/20 1034  BP: 138/84  Pulse: (!) 109  Temp: 97.9 F (36.6 C)  SpO2: 99%     Physical Exam Constitutional:      Appearance: She is obese.  HENT:     Head: Normocephalic and atraumatic.     Nose: No congestion.     Mouth/Throat:     Mouth: Mucous membranes are moist.  Eyes:     General:        Right eye: No discharge.        Left eye: No discharge.     Pupils: Pupils are equal, round, and reactive to light.  Cardiovascular:     Rate and Rhythm: Normal rate  and regular rhythm.     Heart sounds: No murmur heard. No friction rub.  Pulmonary:     Effort: No respiratory distress.     Breath sounds: No stridor. No wheezing or rhonchi.  Musculoskeletal:     Cervical back: No rigidity or tenderness.  Neurological:     Mental Status: She is alert.  Psychiatric:        Mood and Affect: Mood normal.    Data Reviewed: PFT reviewed with the patient -Flow volume loop is slightly scooped however FEV1, FVC, total lung capacity and DLCO are within normal limits  CT scan was reviewed with the patient -Areas consistent with bronchiectasis, mucus in the airway, bronchitis noted  Assessment:  Asthma  Recent bronchitis  Abnormal CT showing areas of bronchiectasis/bronchitis  Symptoms are improving currently on albuterol    Plan/Recommendations: She can leave of Breo at present  Continue albuterol as needed  Graded exercises with goal of losing weight encouraged  I will see her back in 6 months  Encouraged to give me a call if she is having any significant shortness of breath or wheezing where she may need to go back on Breo  Repeat CT scan without contrast in 6 months to assess areas of bronchiectasis  Encouraged to call with any significant concerns   Sherrilyn Rist MD [facility] 06/18/2020, 10:45 AM  CC: Jonathon Jordan, MD

## 2020-06-18 NOTE — Patient Instructions (Addendum)
Continue to use albuterol as needed  If you are having problems, you may need to go back on Breo  Graded exercises as tolerated-should be on a regular basis, goal is weight loss   We will repeat your CT scan  In 6 months  Follow-up appointment in 6 months  Call with significant concerns, call if symptoms are not continuing to be well controlled or if you are needing to use albuterol on a regular basis-this will mean you need additional inhalers

## 2020-07-26 ENCOUNTER — Telehealth: Payer: Self-pay | Admitting: Pulmonary Disease

## 2020-07-26 NOTE — Telephone Encounter (Signed)
I have called and spoke with Samantha [name]  And she stated that the breo is very expensive and she cannot afford this. She is requesting that something else be sent in to her pharmacy.  AO please advise. Thanks

## 2020-07-27 ENCOUNTER — Telehealth: Payer: Self-pay | Admitting: Pulmonary Disease

## 2020-07-27 ENCOUNTER — Telehealth: Payer: Self-pay | Admitting: Primary Care

## 2020-07-27 MED ORDER — PREDNISONE 20 MG PO TABS: 20.0000 mg | ORAL_TABLET | Freq: Every day | ORAL | 0 refills | Status: DC

## 2020-07-27 NOTE — Telephone Encounter (Signed)
Please send in prednisone 20mg  x 5 days and have her take robitussin cough syrup. Let office know when she gets formulary list from her insurance

## 2020-07-27 NOTE — Telephone Encounter (Signed)
Samantha [name] from [place] review. Samantha [name] has clinical questions for Breo. Samantha [name] phone number is [phone].

## 2020-07-27 NOTE — Telephone Encounter (Signed)
I have called [employer] back and answered the clinical questions.  I advised them that we were calling since the covered medications are the same price as the Breo.  I advised them that is why we are doing the tier exception since the pt is not able to afford the medication.  She will submit the exception and it can take up to 72 hours for a response.  They will fax this to our office.  Reference # is [id number]

## 2020-07-27 NOTE — Telephone Encounter (Signed)
Sent to Dr. Jenetta Downer by mistake. Routing to [employer].

## 2020-07-27 NOTE — Telephone Encounter (Signed)
Patient is aware of below recommendations and voiced his understanding.  Rx for prednisone has been sent to preferred pharmacy. Patient stated that she has taken prednisone previously without reaction.  Patient will call back with list of covered medications.  Nothing further needed at this time.

## 2020-07-27 NOTE — Telephone Encounter (Signed)
Message from encounter yesterday 4/21: Imagene Gurney to Courtney Heys     07/26/20 9:52 AM Patient states [facility] is too expensive at pharmacy. Would like something else called into pharmacy. Pharmacy is [facility]. Patient phone number is [phone].    Called and spoke with pt letting her know that we had sent message about the breo inhaler to Dr. Jenetta Downer but also stated to her for her to contact insurance company to get them to provide her a formulary list of covered inhalers so that way we can be able to have that info to provide Dr. Jenetta Downer with to see what he would like to put her on in place of the breo and she verbalized understanding.   While speaking with pt, she said that she did develop a cough overnight 4/21 and states that she is coughing up yellow phlegm. Pt also said that she has been wheezing. Pt denies any complaints of any fever.  Asked pt if she had tried any OTC meds to see if it would help with the cough and she said that she had not. Pt is wanting recommendations and if a cough syrup or something else could be prescribed.  Dr. Jenetta Downer is not avail today so sending to provider of the day. Beth, please advise.

## 2020-07-30 ENCOUNTER — Other Ambulatory Visit: Payer: Self-pay | Admitting: Pulmonary Disease

## 2020-07-30 MED ORDER — TRELEGY ELLIPTA 100-62.5-25 MCG/INH IN AEPB: 1.0000 | INHALATION_SPRAY | Freq: Every day | RESPIRATORY_TRACT | 2 refills | Status: DC

## 2020-07-30 NOTE — Telephone Encounter (Signed)
Aware. 

## 2020-07-31 NOTE — Telephone Encounter (Signed)
Will forward to triage to see if fax has come through on this.

## 2020-07-31 NOTE — Telephone Encounter (Signed)
Tier exception was denied.  "Since there isn't a brand name drug for your condition in a lower cost-sharing tier, a tier exception is not permitted".   AO please advise on which covered drug listed below you'd like to try. Perhaps we can apply for patient assistance for these drugs? Thanks!

## 2020-07-31 NOTE — Telephone Encounter (Signed)
Tier exception was received and had been placed in PA folder. Filled out form to best of my ability, and faxed to [facility] with a confirmation received. Will keep in "initiated PA" folder for follow-up.

## 2020-07-31 NOTE — Telephone Encounter (Signed)
Still awaiting fax, nothing received yet.

## 2020-08-02 NOTE — Telephone Encounter (Signed)
Advair 250  1 puff twice daily

## 2020-08-03 NOTE — Telephone Encounter (Signed)
lmtcb X1 to discuss rx change with pt.

## 2020-08-06 MED ORDER — FLUTICASONE-SALMETEROL 250-50 MCG/ACT IN AEPB
1.0000 | INHALATION_SPRAY | Freq: Two times a day (BID) | RESPIRATORY_TRACT | 11 refills | Status: DC
Start: 2020-08-06 — End: 2020-11-22

## 2020-08-06 NOTE — Telephone Encounter (Signed)
Left message for patient to call back  

## 2020-08-06 NOTE — Telephone Encounter (Signed)
Rx for Advair 250 has been sent to preferred pharmacy.  Patient is aware and voiced her understanding. Nothing further needed at this time.

## 2020-08-28 DIAGNOSIS — M170 Bilateral primary osteoarthritis of knee: Secondary | ICD-10-CM | POA: Diagnosis not present

## 2020-08-28 DIAGNOSIS — Z0000 Encounter for general adult medical examination without abnormal findings: Secondary | ICD-10-CM | POA: Diagnosis not present

## 2020-08-28 DIAGNOSIS — J479 Bronchiectasis, uncomplicated: Secondary | ICD-10-CM | POA: Diagnosis not present

## 2020-08-28 DIAGNOSIS — Z23 Encounter for immunization: Secondary | ICD-10-CM | POA: Diagnosis not present

## 2020-08-28 DIAGNOSIS — K219 Gastro-esophageal reflux disease without esophagitis: Secondary | ICD-10-CM | POA: Diagnosis not present

## 2020-08-28 DIAGNOSIS — I10 Essential (primary) hypertension: Secondary | ICD-10-CM | POA: Diagnosis not present

## 2020-08-28 DIAGNOSIS — Z79899 Other long term (current) drug therapy: Secondary | ICD-10-CM | POA: Diagnosis not present

## 2020-08-28 DIAGNOSIS — E559 Vitamin D deficiency, unspecified: Secondary | ICD-10-CM | POA: Diagnosis not present

## 2020-08-28 DIAGNOSIS — R21 Rash and other nonspecific skin eruption: Secondary | ICD-10-CM | POA: Diagnosis not present

## 2020-09-21 DIAGNOSIS — M170 Bilateral primary osteoarthritis of knee: Secondary | ICD-10-CM | POA: Diagnosis not present

## 2020-10-23 DIAGNOSIS — M1711 Unilateral primary osteoarthritis, right knee: Secondary | ICD-10-CM | POA: Diagnosis not present

## 2020-10-23 DIAGNOSIS — M1712 Unilateral primary osteoarthritis, left knee: Secondary | ICD-10-CM | POA: Diagnosis not present

## 2020-10-23 DIAGNOSIS — M170 Bilateral primary osteoarthritis of knee: Secondary | ICD-10-CM | POA: Diagnosis not present

## 2020-11-02 DIAGNOSIS — M170 Bilateral primary osteoarthritis of knee: Secondary | ICD-10-CM | POA: Diagnosis not present

## 2020-11-06 DIAGNOSIS — M1711 Unilateral primary osteoarthritis, right knee: Secondary | ICD-10-CM | POA: Diagnosis not present

## 2020-11-06 DIAGNOSIS — M170 Bilateral primary osteoarthritis of knee: Secondary | ICD-10-CM | POA: Diagnosis not present

## 2020-11-06 DIAGNOSIS — M1712 Unilateral primary osteoarthritis, left knee: Secondary | ICD-10-CM | POA: Diagnosis not present

## 2020-11-22 ENCOUNTER — Ambulatory Visit (INDEPENDENT_AMBULATORY_CARE_PROVIDER_SITE_OTHER): Payer: Medicare HMO

## 2020-11-22 ENCOUNTER — Other Ambulatory Visit: Payer: Self-pay | Admitting: Obstetrics & Gynecology

## 2020-11-22 ENCOUNTER — Encounter: Payer: Self-pay | Admitting: Obstetrics & Gynecology

## 2020-11-22 ENCOUNTER — Ambulatory Visit: Payer: Medicare HMO | Admitting: Obstetrics & Gynecology

## 2020-11-22 ENCOUNTER — Other Ambulatory Visit (HOSPITAL_COMMUNITY)
Admission: RE | Admit: 2020-11-22 | Discharge: 2020-11-22 | Disposition: A | Discharge status: Home / Self Care | Payer: Medicare HMO | Source: Ambulatory Visit | Attending: Obstetrics & Gynecology | Admitting: Obstetrics & Gynecology

## 2020-11-22 ENCOUNTER — Other Ambulatory Visit: Payer: Self-pay

## 2020-11-22 VITALS — BP 114/70

## 2020-11-22 DIAGNOSIS — N8501 Benign endometrial hyperplasia: Secondary | ICD-10-CM | POA: Diagnosis not present

## 2020-11-22 DIAGNOSIS — N950 Postmenopausal bleeding: Secondary | ICD-10-CM

## 2020-11-22 DIAGNOSIS — R9389 Abnormal findings on diagnostic imaging of other specified body structures: Secondary | ICD-10-CM | POA: Insufficient documentation

## 2020-11-22 DIAGNOSIS — R939 Diagnostic imaging inconclusive due to excess body fat of patient: Secondary | ICD-10-CM

## 2020-11-22 NOTE — Progress Notes (Signed)
    Samantha [name] [birth year] [id number]        65 y.o.  [id number]   RP: PMB for Pelvic US  HPI: No change x last visit in 05/2020.  PMB x 1 in December 2021, light and accompanied by mild pelvic cramping.  Didn't happen after IC.  Postmenopausal x many years, no HRT.  Having more hot flushes x about 1 year.  Urine/BMs normal.     OB History  Gravida Para Term Preterm AB Living  '6 3     2 3  '$ SAB IAB Ectopic Multiple Live Births  1   0        # Outcome Date GA Lbr Len/2nd Weight Sex Delivery Anes PTL Lv  6 Gravida           5 Para           4 Para           3 Para           2 AB           1 SAB             Past medical history,surgical history, problem list, medications, allergies, family history and social history were all reviewed and documented in the EPIC chart.   Directed ROS with pertinent positives and negatives documented in the history of present illness/assessment and plan.  Exam:  Vitals:   11/22/20 1437  BP: 114/70   General appearance:  Normal  Pelvic US today: T/V and T/a images.  Anteverted uterus inhomogenous myometrium with no definite mass seen.  The uterus is measured at 9.83 x 4.3 x 5.61 cm.  The endometrium is difficult to evaluate due to position of the uterus and patient's body habitus but is measured at 4 to 5 mm with no obvious mass or abnormal blood flow.  Neither ovary is positively identified, but no adnexal mass or free fluid is seen abdominally or vaginally.  Endometrial Biopsy: Written consent obtained.  Betadine prep on the cervix.  HurriCaine spray on the cervix.  Easy passage of the endometrial suction cannula into the intra uterine cavity.  Endometrial biopsy done by suction on all endometrial walls.  Good specimen obtained.  Specimen sent to pathology.  All instruments removed.  Well-tolerated by patient with no complication.   Assessment/Plan:  65 y.o. [id number]   1. Postmenopausal bleeding Postmenopausal bleeding with mildly thickened  endometrium.  By pelvic ultrasound.  Decision to proceed with an endometrial biopsy. - Surgical pathology( [place]/ [facility])  2. Thickened endometrium Endometrial biopsy done today.  Well-tolerated by patient with no complication.  Good specimen obtained, sent to pathology.  Postprocedure precautions reviewed.  Will manage per endometrial biopsy results. - Surgical pathology( [place]/ [facility])  Other orders - BREO ELLIPTA 200-25 MCG/INH AEPB - pantoprazole (PROTONIX) 40 MG tablet   [name] MD, 2:46 PM 11/22/2020

## 2020-11-25 ENCOUNTER — Encounter: Payer: Self-pay | Admitting: Obstetrics & Gynecology

## 2020-11-26 LAB — SURGICAL PATHOLOGY

## 2020-11-27 ENCOUNTER — Encounter: Payer: Self-pay | Admitting: Obstetrics & Gynecology

## 2020-12-02 DIAGNOSIS — Z8616 Personal history of COVID-19: Secondary | ICD-10-CM

## 2020-12-02 HISTORY — DX: Personal history of COVID-19: Z86.16

## 2020-12-12 ENCOUNTER — Other Ambulatory Visit: Payer: Medicare HMO

## 2020-12-14 ENCOUNTER — Ambulatory Visit: Payer: Medicare Other | Admitting: Obstetrics & Gynecology

## 2020-12-14 DIAGNOSIS — Z20822 Contact with and (suspected) exposure to covid-19: Secondary | ICD-10-CM | POA: Diagnosis not present

## 2020-12-22 ENCOUNTER — Ambulatory Visit
Admission: RE | Admit: 2020-12-22 | Discharge: 2020-12-22 | Disposition: A | Discharge status: Home / Self Care | Payer: Medicare HMO | Source: Ambulatory Visit | Attending: Pulmonary Disease | Admitting: Pulmonary Disease

## 2020-12-22 DIAGNOSIS — J9811 Atelectasis: Secondary | ICD-10-CM | POA: Diagnosis not present

## 2020-12-22 DIAGNOSIS — J439 Emphysema, unspecified: Secondary | ICD-10-CM | POA: Diagnosis not present

## 2020-12-22 DIAGNOSIS — J209 Acute bronchitis, unspecified: Secondary | ICD-10-CM

## 2020-12-22 DIAGNOSIS — J449 Chronic obstructive pulmonary disease, unspecified: Secondary | ICD-10-CM | POA: Diagnosis not present

## 2020-12-26 ENCOUNTER — Encounter: Payer: Self-pay | Admitting: Pulmonary Disease

## 2020-12-26 ENCOUNTER — Ambulatory Visit: Payer: Medicare HMO | Admitting: Pulmonary Disease

## 2020-12-26 ENCOUNTER — Other Ambulatory Visit: Payer: Self-pay

## 2020-12-26 VITALS — BP 126/70 | HR 97 | Temp 98.2°F | Ht 64.0 in | Wt 256.4 lb

## 2020-12-26 DIAGNOSIS — R1310 Dysphagia, unspecified: Secondary | ICD-10-CM | POA: Diagnosis not present

## 2020-12-26 NOTE — Patient Instructions (Signed)
Continue with Breo  I will see you in 6 months  Let your primary doctor know about the dysphagia/food getting stuck  Referral to GI doc for possible endoscopy  Call with significant concerns

## 2020-12-26 NOTE — Progress Notes (Signed)
Samantha [name]    [id number]    May 25, 1955  Primary Care Physician:Wolters, Ivin Booty, MD  Referring Physician: Jonathon Jordan, MD [address],  [place] [address]  Chief complaint:   Patient being seen for cough and shortness of breath  HPI:  Patient being seen for cough and shortness of breath Had bronchitis around Thanksgiving Had multiple exacerbations  Has been on Breo which has helped symptoms She did try to stop Breo but noticed recurrence of symptoms  She is feeling well at present Rarely needs rescue inhalers  Had PFT performed showing mild obstructive disease  Past history of asthma but she felt she outgrew it  She did have albuterol-has only been needing albuterol less than once a week Symptoms are much better Still admits to cough, clear phlegm, no feelings of shortness of breath with mild exertion Denies any chest pain or chest discomfort  She does have occasional wheezing  No pets No recent travel  Reformed smoker Half a pack a day  Describes food getting stuck in her throat, does have a history of reflux, hernia  Outpatient Encounter Medications as of 12/26/2020  Medication Sig   albuterol (VENTOLIN HFA) 108 (90 Base) MCG/ACT inhaler    aspirin EC 81 MG tablet Take 81 mg by mouth daily.   BREO ELLIPTA 200-25 MCG/INH AEPB    cetirizine (ZYRTEC) 10 MG tablet Take 10 mg by mouth daily.   lisinopril-hydrochlorothiazide (PRINZIDE,ZESTORETIC) 20-12.5 MG tablet Take 1 tablet by mouth daily.   pantoprazole (PROTONIX) 40 MG tablet    No facility-administered encounter medications on file as of 12/26/2020.    Allergies as of 12/26/2020 - Review Complete 12/26/2020  Allergen Reaction Noted   Fexofenadine Itching 06/01/2018   Flonase [fluticasone] Other (See Comments) 04/18/2020   Singulair [montelukast]  05/15/2020    Past Medical History:  Diagnosis Date   COPD (chronic obstructive pulmonary disease) ([place])     Hypertension    Hypertension    Stroke [facility])     Past Surgical History:  Procedure Laterality Date   GALLBLADDER SURGERY      Family History  Problem Relation Age of Onset   Hypertension Mother    Anuerysm Mother     Social History   Socioeconomic History   Marital status: Married    Spouse name: Not on file   Number of children: Not on file   Years of education: Not on file   Highest education level: Not on file  Occupational History   Not on file  Tobacco Use   Smoking status: Former    Types: Cigarettes    Quit date: 04/14/2020    Years since quitting: 0.7   Smokeless tobacco: Never  Substance and Sexual Activity   Alcohol use: Not Currently   Drug use: No   Sexual activity: Yes    Partners: Male    Birth control/protection: Post-menopausal  Other Topics Concern   Not on file  Social History Narrative   Not on file   Social Determinants of Health   Financial Resource Strain: Not on file  Food Insecurity: Not on file  Transportation Needs: Not on file  Physical Activity: Not on file  Stress: Not on file  Social Connections: Not on file  Intimate Partner Violence: Not on file    Review of Systems  Respiratory:  Negative for cough, chest tightness, shortness of breath and wheezing.   Psychiatric/Behavioral:  Negative for sleep disturbance.  Vitals:   12/26/20 1428  BP: 126/70  Pulse: 97  Temp: 98.2 F (36.8 C)  SpO2: 99%     Physical Exam Constitutional:      Appearance: She is obese.  HENT:     Head: Normocephalic and atraumatic.     Nose: No congestion.     Mouth/Throat:     Mouth: Mucous membranes are moist.  Eyes:     General:        Right eye: No discharge.        Left eye: No discharge.     Pupils: Pupils are equal, round, and reactive to light.  Cardiovascular:     Rate and Rhythm: Normal rate and regular rhythm.     Heart sounds: No murmur heard.   No friction rub.  Pulmonary:     Effort: No respiratory distress.      Breath sounds: No stridor. No wheezing or rhonchi.  Musculoskeletal:     Cervical back: No rigidity or tenderness.  Neurological:     Mental Status: She is alert.  Psychiatric:        Mood and Affect: Mood normal.   Data Reviewed: PFT reviewed with the patient -Flow volume loop is slightly scooped however FEV1, FVC, total lung capacity and DLCO are within normal limits  CT scan 12/22/2020 reviewed with the patient showing mild emphysema  Assessment:  Asthma  Bronchitis/emphysema on CT  Symptoms are improving currently on albuterol -Breo seems to be helping    Plan/Recommendations: Continue Breo  Graded exercise as tolerated  Follow-up in 6 months  Encouraged to call with any significant concerns  Encouraged to bring up issue of dysphagia with primary care, may benefit from GI evaluation  Sherrilyn Rist MD [facility] 12/26/2020, 2:31 PM  CC: Jonathon Jordan, MD

## 2021-01-17 ENCOUNTER — Ambulatory Visit: Payer: Medicare Other | Admitting: Obstetrics & Gynecology

## 2021-01-23 ENCOUNTER — Ambulatory Visit: Payer: Medicare Other | Admitting: Obstetrics & Gynecology

## 2021-01-23 ENCOUNTER — Other Ambulatory Visit: Payer: Self-pay

## 2021-01-23 ENCOUNTER — Encounter: Payer: Self-pay | Admitting: Obstetrics & Gynecology

## 2021-01-23 ENCOUNTER — Telehealth: Payer: Self-pay

## 2021-01-23 VITALS — BP 106/76

## 2021-01-23 DIAGNOSIS — N950 Postmenopausal bleeding: Secondary | ICD-10-CM

## 2021-01-23 DIAGNOSIS — N8501 Benign endometrial hyperplasia: Secondary | ICD-10-CM

## 2021-01-23 NOTE — Progress Notes (Signed)
    Samantha [name] 06-22-1955 [id number]        65 y.o.  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  No pelvic pain.  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   OB History  Gravida Para Term Preterm AB Living  6 3     2 3   SAB IAB Ectopic Multiple Live Births  1   0        # Outcome Date GA Lbr Len/2nd Weight Sex Delivery Anes PTL Lv  6 Gravida           5 Para           4 Para           3 Para           2 AB           1 SAB             Past medical history,surgical history, problem list, medications, allergies, family history and social history were all reviewed and documented in the EPIC chart.   Directed ROS with pertinent positives and negatives documented in the history of present illness/assessment and plan.  Exam:  Vitals:   01/23/21 1100  BP: 106/76   General appearance:  Normal  Gynecologic exam: Deferred  EBx 11/22/2020: FINAL MICROSCOPIC DIAGNOSIS:   A. ENDOMETRIUM, BIOPSY:  - Microscopic fragment with hyperplasia.  The tissue fragment is small and shows simple hyperplasia.  Evidence of atypia is not seen.   Assessment/Plan:  65 y.o. [id number]   1. Postmenopausal bleeding Endometrial biopsy showed simple endometrial hyperplasia without atypia.  The specimen was very small.  2. Simple endometrial hyperplasia without atypia  Minute specimen may have missed a precancerous or cancerous lesion.  Decision to proceed with HSC/Myosure Excision/D+C.  Surgery and risks reviewed with patient.  Pamphlet given.  Follow-up preop.  [name] MD, 11:22 AM 01/23/2021

## 2021-01-23 NOTE — Telephone Encounter (Signed)
-----   Message from [name], MD sent at 01/23/2021 11:27 AM EDT ----- Regarding: Schedule Surgery Surgery: Hysteroscopy, Myosure Excision, Dilation and Curettage  Diagnosis:  Postmenopausal bleeding with minute specimen showing Endometrial simple hyperplasia with no atypia  Location: [place]  Status: Outpatient  Time: 30 Minutes  Assistant: N/A  Urgency: First Available  Pre-Op Appointment: To Be Scheduled  Post-Op Appointment(s): 2 Weeks  Time Out Of Work: Day Of Surgery ONLY

## 2021-01-24 ENCOUNTER — Encounter: Payer: Self-pay | Admitting: Obstetrics & Gynecology

## 2021-01-25 NOTE — Telephone Encounter (Signed)
Spoke with patient regarding surgery benefits. Patient acknowledges understanding of information presented. Patient is aware that benefits presented are professional benefits only. Patient is aware that once surgery is scheduled, the hospital will call with separate benefits. See account note.  Routing to Jill Hamm, RN, for surgery scheduling. 

## 2021-01-29 NOTE — Telephone Encounter (Signed)
Left message to call Merriel Zinger, RN at [facility], [phone].  

## 2021-01-30 NOTE — Telephone Encounter (Signed)
Spoke with patient. Reviewed surgery dates. Patient request to proceed with surgery on 02/20/21. I will return call once surgery date and time confirmed. Patient verbalizes understanding and is agreeable.   Surgery request sent.

## 2021-01-31 NOTE — Telephone Encounter (Signed)
Spoke with patient. Surgery date request confirmed.  Advised surgery is scheduled for 02/20/21, [facility] at 0830.  Surgery instruction sheet and hospital brochure reviewed, printed copy will be mailed.  Patient advised of Covid screening and quarantine requirements and agreeable.   Routing to provider. Encounter closed.  Cc: Hayley Carder

## 2021-02-05 ENCOUNTER — Encounter: Payer: Self-pay | Admitting: Obstetrics & Gynecology

## 2021-02-05 ENCOUNTER — Other Ambulatory Visit: Payer: Self-pay

## 2021-02-05 ENCOUNTER — Telehealth: Payer: Medicare HMO | Admitting: Obstetrics & Gynecology

## 2021-02-05 DIAGNOSIS — R9389 Abnormal findings on diagnostic imaging of other specified body structures: Secondary | ICD-10-CM | POA: Diagnosis not present

## 2021-02-05 DIAGNOSIS — N950 Postmenopausal bleeding: Secondary | ICD-10-CM

## 2021-02-05 DIAGNOSIS — N8501 Benign endometrial hyperplasia: Secondary | ICD-10-CM

## 2021-02-05 NOTE — Progress Notes (Signed)
    ALIANAH LOFTON 1956-03-11 [id number]        65 y.o.  [id number]  Video visit, informed consent obtained.  Patient well identified.  Preop counseing with patient in her home and myself in my [employer].  RP: Preop HSC/Myosure Excision/D+C  HPI: PMB with EBx Minute specimen showing Simple Hyperplasia without Atypia.   OB History  Gravida Para Term Preterm AB Living  6 3     2 3   SAB IAB Ectopic Multiple Live Births  1   0        # Outcome Date GA Lbr Len/2nd Weight Sex Delivery Anes PTL Lv  6 Gravida           5 Para           4 Para           3 Para           2 AB           1 SAB             Past medical history,surgical history, problem list, medications, allergies, family history and social history were all reviewed and documented in the EPIC chart.   Directed ROS with pertinent positives and negatives documented in the history of present illness/assessment and plan.  Exam:  There were no vitals filed for this visit. General appearance:  Normal  Gynecologic exam: Deferred, videovisit.   Assessment/Plan:  65 y.o. [id number]   1. Postmenopausal bleeding PMB on no HRT.  Pelvic US showed a thickened endometrium, therefore an EBx was done.  The specimen was minute, showing simple Hyperplasia without atypia.  Given that the specimen was so small, it may not be representative and a more significant lesion that is precancer or cancer could have been missed.  For that reason we are planning a hysteroscopy with MyoSure excision as needed and dilation and curettage.  Preop preparation reviewed with Bashan.  Fasting for 6 hours before the procedure recommended.  Surgical procedure with risks thoroughly reviewed with patient.  Patient informed of the risk of perforation of the uterus.  Antibiotics IV will be given and patient will wear PAS to help circulation and avoid blood clots.  Postop precautions and expectations reviewed.  Patient voiced understanding and agreement with  plan.  2. Thickened endometrium EBX done.  3. Simple endometrial hyperplasia without atypia  Minute specimen of simple endometrial hyperplasia without atypia.                        Patient was counseled as to the risk of surgery to include the following:  1. Infection (prohylactic antibiotics will be administered)  2. DVT/Pulmonary Embolism (prophylactic pneumo compression stockings will be used)  3.Trauma to internal organs requiring additional surgical procedure to repair any injury to internal organs requiring perhaps additional hospitalization days.  4.Hemmorhage requiring transfusion and blood products which carry risks such as anaphylactic reaction, hepatitis and AIDS  Patient had received literature information on the procedure scheduled and all her questions were answered and fully accepts all risk.    [name] MD, 10:50 AM 02/05/2021

## 2021-02-07 ENCOUNTER — Telehealth: Payer: Medicare HMO | Admitting: Obstetrics & Gynecology

## 2021-02-15 ENCOUNTER — Telehealth: Payer: Self-pay | Admitting: *Deleted

## 2021-02-15 NOTE — Telephone Encounter (Signed)
Pt had some questions related to surgery that's scheduled 02/20/2021.DILATATION & CURETTAGE/HYSTEROSCOPY WITH MYOSURE Will the Anesthesia be general or local? Is there any medications she should not take before surgery? I will route this message to Glorianne Manchester RN to go over surgery instructions.

## 2021-02-18 NOTE — Telephone Encounter (Signed)
Spoke with patient.  Answered questions below.  Patient reports she has concerns about general anesthesia she would like to discuss. Reports being "hoarse, throat swelling and getting strangled when eating food". Patient reports this has been going on for several months, has discussed with PCP and is on Protonix. Would like to know if any concerns from anesthesia?   Advised patient I can forward concerns to Dr. Dellis Filbert and PAT at [facility] for return call. Patient agreeable.   Message to PAT.   Routing to Dr. Ileene Musa.

## 2021-02-19 ENCOUNTER — Other Ambulatory Visit: Payer: Self-pay

## 2021-02-19 ENCOUNTER — Encounter (HOSPITAL_BASED_OUTPATIENT_CLINIC_OR_DEPARTMENT_OTHER): Payer: Self-pay | Admitting: Obstetrics & Gynecology

## 2021-02-19 NOTE — Progress Notes (Signed)
Spoke w/ via phone for pre-op interview---pt Lab needs dos----cbc, bmp, ekg               Lab results------ no COVID test -----patient states asymptomatic no test needed Arrive at ------- 0630 on 02-20-2021 NPO after MN NO Solid Food.  Clear liquids from MN until---0530 Med rec completed Medications to take morning of surgery ----- protonix, breo inhaler Diabetic medication ----- n/a Patient instructed no nail polish to be worn day of surgery Patient instructed to bring photo id and insurance card day of surgery Patient aware to have Driver (ride ) / caregiver  for 24 hours after surgery--husband, paul  Patient Special Instructions ----- to bring breo inhaler dos , she does not have a current rescue inhaler Pre-Op special Istructions ----- n/a Patient verbalized understanding of instructions that were given at this phone interview. Patient denies shortness of breath, chest pain, fever, cough at this phone interview.    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 Pt does get DOE, no oxygen.  Pt concerned with anesthesia due to unable to speak for days after tube in throat 1996 w/ cholecystectomy. Advised pt she will speak with anesthesia dos.  PCP: Dr Chauncey Cruel. Wolters  (per pt lov 10/ 2022 ) Pulmonologist:  Dr A. Reginia Naas 12-26-2020 epic) Chest x-ray : CT 12-22-2020 EKG : 11-05-2017 epic  Blood Thinner/ Instructions Maryjane Hurter Dose: no ASA / Instructions/ Last Dose :  ASA mg

## 2021-02-19 NOTE — Anesthesia Preprocedure Evaluation (Addendum)
Anesthesia Evaluation  Patient identified by MRN, date of birth, ID band Patient awake    Reviewed: Allergy & Precautions, NPO status , Patient's Chart, lab work & pertinent test results  Airway Mallampati: II  TM Distance: >3 FB Neck ROM: Full    Dental no notable dental hx. (+) Teeth Intact, Dental Advisory Given   Pulmonary COPD,  COPD inhaler, former smoker,    Pulmonary exam normal breath sounds clear to auscultation       Cardiovascular hypertension, Pt. on medications Normal cardiovascular exam Rhythm:Regular Rate:Normal     Neuro/Psych negative neurological ROS     GI/Hepatic negative GI ROS, Neg liver ROS,   Endo/Other  Morbid obesity (BMI 43.91)  Renal/GU      Musculoskeletal  (+) Arthritis ,   Abdominal (+) + obese (BMI 45.49),   Peds  Hematology   Anesthesia Other Findings ALL: Fexofenadine, Flonase, Singulair  Reproductive/Obstetrics                            Anesthesia Physical Anesthesia Plan  ASA: 3  Anesthesia Plan: General   Post-op Pain Management:    Induction: Intravenous  PONV Risk Score and Plan: 4 or greater and Treatment may vary due to age or medical condition, Ondansetron and Midazolam  Airway Management Planned: LMA  Additional Equipment: None  Intra-op Plan:   Post-operative Plan:   Informed Consent: I have reviewed the patients History and Physical, chart, labs and discussed the procedure including the risks, benefits and alternatives for the proposed anesthesia with the patient or authorized representative who has indicated his/her understanding and acceptance.     Dental advisory given  Plan Discussed with:   Anesthesia Plan Comments:        Anesthesia Quick Evaluation

## 2021-02-19 NOTE — Telephone Encounter (Signed)
Burnard Leigh, RN  Burnice Logan, RN Yes, MAC is an option here _0 .  Most of ttime MAC is used for this procedure.  When I spoke w/ pt her concern was the tube.  She stated when she had her cholecystectomy done in 1996 it took days for her to speak again, which an ETT tube would have been used.  Also, I think she was concerned about her COPD.  Which we tell pt's when they have COPD or Asthma , anesthesia will assess dos because today your ok and the next morning you could be having symptoms.  She even said her last exacerbation was last month.  So hopefully she will sound good in the morning , and sometimes anesthesia may do a nebulizer before surgery in pre-op and their good to go.  Does this help/ or answer the question?  Thanks!  Denise    Reviewed with PAT. Call returned to patient. Patient verbalizes understanding and is agreeable to proceed as scheduled. She will further discuss with anesthesia in the morning. Questions answered.   Routing to Dr. Ileene Musa.   Encounter closed.

## 2021-02-19 NOTE — Telephone Encounter (Signed)
Samantha Hidden, MD  You Yesterday (9:16 AM)   Please inform patient that her surgery can be done without general Anesthesia.  Can use a Paracervical block and IV sedation.

## 2021-02-20 ENCOUNTER — Encounter (HOSPITAL_BASED_OUTPATIENT_CLINIC_OR_DEPARTMENT_OTHER)
Admission: RE | Disposition: A | Discharge status: Home / Self Care | Payer: Self-pay | Source: Home / Self Care | Attending: Obstetrics & Gynecology

## 2021-02-20 ENCOUNTER — Encounter (HOSPITAL_BASED_OUTPATIENT_CLINIC_OR_DEPARTMENT_OTHER): Payer: Self-pay | Admitting: Obstetrics & Gynecology

## 2021-02-20 ENCOUNTER — Ambulatory Visit (HOSPITAL_BASED_OUTPATIENT_CLINIC_OR_DEPARTMENT_OTHER): Payer: Medicare HMO | Admitting: Anesthesiology

## 2021-02-20 ENCOUNTER — Ambulatory Visit (HOSPITAL_BASED_OUTPATIENT_CLINIC_OR_DEPARTMENT_OTHER)
Admission: RE | Admit: 2021-02-20 | Discharge: 2021-02-20 | Disposition: A | Discharge status: Home / Self Care | Payer: Medicare HMO | Attending: Obstetrics & Gynecology | Admitting: Obstetrics & Gynecology

## 2021-02-20 DIAGNOSIS — N950 Postmenopausal bleeding: Secondary | ICD-10-CM | POA: Diagnosis not present

## 2021-02-20 DIAGNOSIS — N840 Polyp of corpus uteri: Secondary | ICD-10-CM | POA: Insufficient documentation

## 2021-02-20 DIAGNOSIS — I10 Essential (primary) hypertension: Secondary | ICD-10-CM | POA: Diagnosis not present

## 2021-02-20 DIAGNOSIS — Z8616 Personal history of COVID-19: Secondary | ICD-10-CM | POA: Diagnosis not present

## 2021-02-20 DIAGNOSIS — R7303 Prediabetes: Secondary | ICD-10-CM | POA: Diagnosis not present

## 2021-02-20 DIAGNOSIS — Z9889 Other specified postprocedural states: Secondary | ICD-10-CM

## 2021-02-20 DIAGNOSIS — N8500 Endometrial hyperplasia, unspecified: Secondary | ICD-10-CM | POA: Diagnosis not present

## 2021-02-20 DIAGNOSIS — M1990 Unspecified osteoarthritis, unspecified site: Secondary | ICD-10-CM | POA: Insufficient documentation

## 2021-02-20 DIAGNOSIS — Z01818 Encounter for other preprocedural examination: Secondary | ICD-10-CM

## 2021-02-20 DIAGNOSIS — Z87891 Personal history of nicotine dependence: Secondary | ICD-10-CM | POA: Diagnosis not present

## 2021-02-20 DIAGNOSIS — N8501 Benign endometrial hyperplasia: Secondary | ICD-10-CM | POA: Diagnosis not present

## 2021-02-20 DIAGNOSIS — Z6841 Body Mass Index (BMI) 40.0 and over, adult: Secondary | ICD-10-CM | POA: Insufficient documentation

## 2021-02-20 DIAGNOSIS — J309 Allergic rhinitis, unspecified: Secondary | ICD-10-CM | POA: Diagnosis not present

## 2021-02-20 HISTORY — DX: Other forms of dyspnea: R06.09

## 2021-02-20 HISTORY — DX: Prediabetes: R73.03

## 2021-02-20 HISTORY — DX: Unspecified osteoarthritis, unspecified site: M19.90

## 2021-02-20 HISTORY — DX: Postmenopausal bleeding: N95.0

## 2021-02-20 HISTORY — DX: Presence of spectacles and contact lenses: Z97.3

## 2021-02-20 HISTORY — DX: Emphysema, unspecified: J43.9

## 2021-02-20 HISTORY — DX: Unspecified rotator cuff tear or rupture of right shoulder, not specified as traumatic: M75.101

## 2021-02-20 HISTORY — PX: DILATATION & CURETTAGE/HYSTEROSCOPY WITH MYOSURE: SHX6511

## 2021-02-20 HISTORY — DX: Polyp of corpus uteri: N84.0

## 2021-02-20 HISTORY — DX: Dysphasia: R47.02

## 2021-02-20 HISTORY — DX: Preglaucoma, unspecified, bilateral: H40.003

## 2021-02-20 HISTORY — DX: Other complications of anesthesia, initial encounter: T88.59XA

## 2021-02-20 HISTORY — DX: Personal history of transient ischemic attack (TIA), and cerebral infarction without residual deficits: Z86.73

## 2021-02-20 LAB — BASIC METABOLIC PANEL
Anion gap: 7 (ref 5–15)
BUN: 12 mg/dL (ref 8–23)
CO2: 25 mmol/L (ref 22–32)
Calcium: 9.6 mg/dL (ref 8.9–10.3)
Chloride: 109 mmol/L (ref 98–111)
Creatinine, Ser: 0.66 mg/dL (ref 0.44–1.00)
GFR, Estimated: >60 mL/min (ref >60)
Glucose, Bld: 110 mg/dL — ABNORMAL HIGH (ref 70–99)
Potassium: 3.5 mmol/L (ref 3.5–5.1)
Sodium: 141 mmol/L (ref 135–145)

## 2021-02-20 LAB — CBC
HCT: 37.7 % (ref 36.0–46.0)
Hemoglobin: 12.2 g/dL (ref 12.0–15.0)
MCH: 27.2 pg (ref 26.0–34.0)
MCHC: 32.4 g/dL (ref 30.0–36.0)
MCV: 84.2 fL (ref 80.0–100.0)
Platelets: 322 10*3/uL (ref 150–400)
RBC: 4.48 MIL/uL (ref 3.87–5.11)
RDW: 14.2 % (ref 11.5–15.5)
WBC: 6.7 10*3/uL (ref 4.0–10.5)
nRBC: 0 % (ref 0.0–0.2)

## 2021-02-20 SURGERY — DILATATION & CURETTAGE/HYSTEROSCOPY WITH MYOSURE
Anesthesia: General

## 2021-02-20 MED ORDER — DEXAMETHASONE SODIUM PHOSPHATE 4 MG/ML IJ SOLN
INTRAMUSCULAR | Status: DC | PRN
  Administered 2021-02-20: 5 mg via INTRAVENOUS

## 2021-02-20 MED ORDER — LIDOCAINE HCL (CARDIAC) PF 100 MG/5ML IV SOSY
PREFILLED_SYRINGE | INTRAVENOUS | Status: DC | PRN
  Administered 2021-02-20: 100 mg via INTRAVENOUS

## 2021-02-20 MED ORDER — LACTATED RINGERS IV SOLN: INTRAVENOUS | Status: DC

## 2021-02-20 MED ORDER — FENTANYL CITRATE (PF) 100 MCG/2ML IJ SOLN
INTRAMUSCULAR | Status: DC | PRN
  Administered 2021-02-20: 50 ug via INTRAVENOUS

## 2021-02-20 MED ORDER — ONDANSETRON HCL 4 MG/2ML IJ SOLN
INTRAMUSCULAR | Status: AC
  Filled 2021-02-20: qty 2

## 2021-02-20 MED ORDER — LIDOCAINE HCL 1 % IJ SOLN
INTRAMUSCULAR | Status: DC | PRN
  Administered 2021-02-20: 20 mL

## 2021-02-20 MED ORDER — MIDAZOLAM HCL 2 MG/2ML IJ SOLN
INTRAMUSCULAR | Status: AC
  Filled 2021-02-20: qty 2

## 2021-02-20 MED ORDER — SODIUM CHLORIDE 0.9 % IR SOLN
Status: DC | PRN
  Administered 2021-02-20: 3000 mL

## 2021-02-20 MED ORDER — PROPOFOL 10 MG/ML IV BOLUS
INTRAVENOUS | Status: AC
  Filled 2021-02-20: qty 20

## 2021-02-20 MED ORDER — POVIDONE-IODINE 10 % EX SWAB: 2.0000 "application " | Freq: Once | CUTANEOUS | Status: DC

## 2021-02-20 MED ORDER — ONDANSETRON HCL 4 MG/2ML IJ SOLN: 4.0000 mg | Freq: Once | INTRAMUSCULAR | Status: DC | PRN

## 2021-02-20 MED ORDER — DEXAMETHASONE SODIUM PHOSPHATE 10 MG/ML IJ SOLN
INTRAMUSCULAR | Status: AC
  Filled 2021-02-20: qty 1

## 2021-02-20 MED ORDER — FENTANYL CITRATE (PF) 100 MCG/2ML IJ SOLN
INTRAMUSCULAR | Status: AC
  Filled 2021-02-20: qty 2

## 2021-02-20 MED ORDER — MIDAZOLAM HCL 5 MG/5ML IJ SOLN
INTRAMUSCULAR | Status: DC | PRN
  Administered 2021-02-20: 1 mg via INTRAVENOUS

## 2021-02-20 MED ORDER — KETOROLAC TROMETHAMINE 30 MG/ML IJ SOLN
INTRAMUSCULAR | Status: DC | PRN
  Administered 2021-02-20: 30 mg via INTRAVENOUS

## 2021-02-20 MED ORDER — CEFAZOLIN SODIUM-DEXTROSE 2-4 GM/100ML-% IV SOLN
INTRAVENOUS | Status: AC
  Filled 2021-02-20: qty 100

## 2021-02-20 MED ORDER — LIDOCAINE 2% (20 MG/ML) 5 ML SYRINGE
INTRAMUSCULAR | Status: AC
  Filled 2021-02-20: qty 5

## 2021-02-20 MED ORDER — CEFAZOLIN SODIUM-DEXTROSE 2-4 GM/100ML-% IV SOLN
2.0000 g | INTRAVENOUS | Status: AC
  Administered 2021-02-20: 2 g via INTRAVENOUS

## 2021-02-20 MED ORDER — ACETAMINOPHEN 10 MG/ML IV SOLN: 1000.0000 mg | Freq: Once | INTRAVENOUS | Status: DC | PRN

## 2021-02-20 MED ORDER — FENTANYL CITRATE (PF) 100 MCG/2ML IJ SOLN: 25.0000 ug | INTRAMUSCULAR | Status: DC | PRN

## 2021-02-20 MED ORDER — PROPOFOL 10 MG/ML IV BOLUS
INTRAVENOUS | Status: DC | PRN
  Administered 2021-02-20: 160 mg via INTRAVENOUS

## 2021-02-20 MED ORDER — KETOROLAC TROMETHAMINE 30 MG/ML IJ SOLN
INTRAMUSCULAR | Status: AC
  Filled 2021-02-20: qty 1

## 2021-02-20 MED ORDER — ONDANSETRON HCL 4 MG/2ML IJ SOLN
INTRAMUSCULAR | Status: DC | PRN
  Administered 2021-02-20: 4 mg via INTRAVENOUS

## 2021-02-20 SURGICAL SUPPLY — 21 items
CATH ROBINSON RED A/P 16FR (CATHETERS) ×1 IMPLANT
DEVICE MYOSURE LITE (MISCELLANEOUS) ×2 IMPLANT
DEVICE MYOSURE REACH (MISCELLANEOUS) IMPLANT
DILATOR CANAL MILEX (MISCELLANEOUS) IMPLANT
ELECT REM PT RETURN 9FT ADLT (ELECTROSURGICAL)
ELECTRODE REM PT RTRN 9FT ADLT (ELECTROSURGICAL) IMPLANT
GAUZE 4X4 16PLY ~~LOC~~+RFID DBL (SPONGE) ×4 IMPLANT
GLOVE SURG ENC MOIS LTX SZ6.5 (GLOVE) ×3 IMPLANT
GLOVE SURG NEOPR MICRO LF SZ7 (GLOVE) ×4 IMPLANT
GLOVE SURG UNDER POLY LF SZ7 (GLOVE) ×6 IMPLANT
GOWN STRL REUS W/TWL LRG LVL3 (GOWN DISPOSABLE) ×6 IMPLANT
IV NS IRRIG 3000ML ARTHROMATIC (IV SOLUTION) ×3 IMPLANT
KIT PROCEDURE FLUENT (KITS) ×3 IMPLANT
KIT TURNOVER CYSTO (KITS) ×3 IMPLANT
MYOSURE XL FIBROID (MISCELLANEOUS)
PACK VAGINAL MINOR WOMEN LF (CUSTOM PROCEDURE TRAY) ×3 IMPLANT
PAD OB MATERNITY 4.3X12.25 (PERSONAL CARE ITEMS) ×3 IMPLANT
PAD PREP 24X48 CUFFED NSTRL (MISCELLANEOUS) ×3 IMPLANT
SEAL CERVICAL OMNI LOK (ABLATOR) IMPLANT
SEAL ROD LENS SCOPE MYOSURE (ABLATOR) ×3 IMPLANT
SYSTEM TISS REMOVAL MYOSURE XL (MISCELLANEOUS) IMPLANT

## 2021-02-20 NOTE — Discharge Instructions (Addendum)
No ibuprofen, Advil, Aleve, Motrin, ketorolac, meloxicam, naproxen, or other NSAIDS until after 3:00pm today if needed for pain.    DISCHARGE INSTRUCTIONS: HYSTEROSCOPY / ENDOMETRIAL ABLATION The following instructions have been prepared to help you care for yourself upon your return home.  May take stool softner while taking narcotic pain medication to prevent constipation.  Drink plenty of water.  Personal hygiene:  Use sanitary pads for vaginal drainage, not tampons.  Shower the day after your procedure.  NO tub baths, pools or Jacuzzis for 2-3 weeks.  Wipe front to back after using the bathroom.  Activity and limitations:  Do NOT drive or operate any equipment for 24 hours. The effects of anesthesia are still present and drowsiness may result.  Do NOT rest in bed all day.  Walking is encouraged.  Walk up and down stairs slowly.  You may resume your normal activity in one to two days or as indicated by your physician. Sexual activity: NO intercourse for at least 2 weeks after the procedure, or as indicated by your Doctor.  Diet: Eat a light meal as desired this evening. You may resume your usual diet tomorrow.  Return to Work: You may resume your work activities in one to two days or as indicated by Marine scientist.  What to expect after your surgery: Expect to have vaginal bleeding/discharge for 2-3 days and spotting for up to 10 days. It is not unusual to have soreness for up to 1-2 weeks. You may have a slight burning sensation when you urinate for the first day. Mild cramps may continue for a couple of days. You may have a regular period in 2-6 weeks.  Call your doctor for any of the following:  Excessive vaginal bleeding or clotting, saturating and changing one pad every hour.  Inability to urinate 6 hours after discharge from hospital.  Pain not relieved by pain medication.  Fever of 100.4 F or greater.  Unusual vaginal discharge or odor.    Post Anesthesia Home Care  Instructions  Activity: Get plenty of rest for the remainder of the day. A responsible individual must stay with you for 24 hours following the procedure.  For the next 24 hours, DO NOT: -Drive a car -Paediatric nurse -Drink alcoholic beverages -Take any medication unless instructed by your physician -Make any legal decisions or sign important papers.  Meals: Start with liquid foods such as gelatin or soup. Progress to regular foods as tolerated. Avoid greasy, spicy, heavy foods. If nausea and/or vomiting occur, drink only clear liquids until the nausea and/or vomiting subsides. Call your physician if vomiting continues.  Special Instructions/Symptoms: Your throat may feel dry or sore from the anesthesia or the breathing tube placed in your throat during surgery. If this causes discomfort, gargle with warm salt water. The discomfort should disappear within 24 hours.

## 2021-02-20 NOTE — Anesthesia Procedure Notes (Signed)
Procedure Name: LMA Insertion Date/Time: 02/20/2021 8:27 AM Performed by: Bufford Spikes, CRNA Pre-anesthesia Checklist: Patient identified, Emergency Drugs available, Suction available and Patient being monitored Patient Re-evaluated:Patient Re-evaluated prior to induction Oxygen Delivery Method: Circle system utilized Preoxygenation: Pre-oxygenation with 100% oxygen Induction Type: IV induction Ventilation: Mask ventilation without difficulty LMA: LMA inserted LMA Size: 4.0 Number of attempts: 1 Placement Confirmation: positive ETCO2 Tube secured with: Tape Dental Injury: Teeth and Oropharynx as per pre-operative assessment

## 2021-02-20 NOTE — Transfer of Care (Signed)
Immediate Anesthesia Transfer of Care Note  Patient: Samantha [name]  Procedure(s) Performed: DILATATION & CURETTAGE/HYSTEROSCOPY WITH MYOSURE  Patient Location: PACU  Anesthesia Type:General  Level of Consciousness: awake, alert  and oriented  Airway & Oxygen Therapy: Patient Spontanous Breathing and Patient connected to nasal cannula oxygen  Post-op Assessment: Report given to RN and Post -op Vital signs reviewed and stable  Post vital signs: Reviewed and stable  Last Vitals:  Vitals Value Taken Time  BP 137/89 02/20/21 0900  Temp    Pulse 87 02/20/21 0901  Resp 12 02/20/21 0901  SpO2 100 % 02/20/21 0901  Vitals shown include unvalidated device data.  Last Pain:  Vitals:   02/20/21 0705  TempSrc: Oral  PainSc: 0-No pain      Patients Stated Pain Goal: 4 (16/38/45 3646)  Complications: No notable events documented.

## 2021-02-20 NOTE — Anesthesia Postprocedure Evaluation (Signed)
Anesthesia Post Note  Patient: Samantha [name]  Procedure(s) Performed: DILATATION & CURETTAGE/HYSTEROSCOPY WITH [place]     Patient location during evaluation: PACU Anesthesia Type: General Level of consciousness: awake and alert Pain management: pain level controlled Vital Signs Assessment: post-procedure vital signs reviewed and stable Respiratory status: spontaneous breathing, nonlabored ventilation, respiratory function stable and patient connected to nasal cannula oxygen Cardiovascular status: blood pressure returned to baseline and stable Postop Assessment: no apparent nausea or vomiting Anesthetic complications: no   No notable events documented.  Last Vitals:  Vitals:   02/20/21 0930 02/20/21 1004  BP: 133/84 (!) 112/40  Pulse: 78 65  Resp: 12 15  Temp: (!) 36.4 C (!) 36 C  SpO2: 98% 100%    Last Pain:  Vitals:   02/20/21 1004  TempSrc:   PainSc: 0-No pain                 Barnet Glasgow

## 2021-02-20 NOTE — H&P (Addendum)
Samantha [name] is an 65 y.o. female. [id number]     RP: HSC/Myosure Excision/D+C   HPI: PMB with EBx Minute specimen showing Simple Hyperplasia without Atypia.    Pertinent Gynecological History: Menses: post-menopausal/PMB Contraception: tubal ligation Blood transfusions: none Sexually transmitted diseases: no past history  Last mammogram: normal  Last pap: normal  Menstrual History:  No LMP recorded. Patient is postmenopausal.    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[facility])    pulmonology--- dr aAnder Slade ,  hoarse,  (02-19-2021  per pt had pcp visit 02-04-2021 told having exacerbation   DOE (dyspnea on exertion)    per pt w/ stairs, but avoids, unable long distence walking due knee pain, ok with housework   Dysphasia    per pt pcp gave her protonix   Endometrial polyp    History of COVID-19 12/02/2020   positive result in care everywhere;   per pt moderate symptoms , like bronchitis,  pt states only occasional resdiual   History of TIA (transient ischemic attack)    per pt in mid to late age 56s , told possible tia but imgaing was negative , symptom was vertigo which resolved , none issue since   Hypertension    Hypertension    OA (osteoarthritis)    knees   PMB (postmenopausal bleeding)    Pre-diabetes    Rotator cuff tear, right    w/ shoulder pain   Wears glasses     Past Surgical History:  Procedure Laterality Date   CHOLECYSTECTOMY  1996   DILATION AND CURETTAGE OF UTERUS  1980   TUBAL LIGATION Bilateral 1992    Family History  Problem Relation Age of Onset   Hypertension Mother    Anuerysm Mother     Social History:  reports that she quit smoking about 10 months ago. Her smoking use included cigarettes. She has never used smokeless tobacco. She reports that she does not currently use alcohol. She  reports that she does not use drugs.  Allergies:  Allergies  Allergen Reactions   Fexofenadine Itching   Flonase [Fluticasone] Itching   Singulair [Montelukast] Itching    Medications Prior to Admission  Medication Sig Dispense Refill Last Dose   aspirin EC 81 MG tablet Take 81 mg by mouth 2 (two) times a week.   Past Week   BREO ELLIPTA 200-25 MCG/INH AEPB Inhale 1 puff into the lungs at bedtime.   02/20/2021   cetirizine (ZYRTEC) 10 MG tablet Take 10 mg by mouth daily.   02/19/2021   lisinopril-hydrochlorothiazide (PRINZIDE,ZESTORETIC) 20-12.5 MG tablet Take 1 tablet by mouth daily.  12 02/19/2021   pantoprazole (PROTONIX) 40 MG tablet Take 40 mg by mouth daily.   02/20/2021 at 0500   albuterol (VENTOLIN HFA) 108 (90 Base) MCG/ACT inhaler Inhale 1-2 puffs into the lungs every 4 (four) hours as needed. (Patient not taking: Reported on 02/19/2021)   Not Taking    REVIEW OF SYSTEMS: A ROS was performed and pertinent positives and negatives are included in the history.  GENERAL: No fevers or chills. HEENT: No change in vision, no earache, sore throat or sinus congestion. NECK: No pain or stiffness. CARDIOVASCULAR: No chest pain or pressure. No palpitations. PULMONARY: No shortness of breath, cough or wheeze. GASTROINTESTINAL: No abdominal pain, nausea, vomiting or diarrhea,  melena or bright red blood per rectum. GENITOURINARY: No urinary frequency, urgency, hesitancy or dysuria. MUSCULOSKELETAL: No joint or muscle pain, no back pain, no recent trauma. DERMATOLOGIC: No rash, no itching, no lesions. ENDOCRINE: No polyuria, polydipsia, no heat or cold intolerance. No recent change in weight. HEMATOLOGICAL: No anemia or easy bruising or bleeding. NEUROLOGIC: No headache, seizures, numbness, tingling or weakness. PSYCHIATRIC: No depression, no loss of interest in normal activity or change in sleep pattern.     Blood pressure (!) 151/89, pulse 88, temperature 98 F (36.7 C), temperature source  Oral, resp. rate 18, height 5\' 4"  (1.626 m), weight 116 kg, SpO2 99 %.  Physical Exam:   Results for orders placed or performed during the hospital encounter of 02/20/21 (from the past 24 hour(s))  CBC     Status: None   Collection Time: 02/20/21  7:30 AM  Result Value Ref Range   WBC 6.7 4.0 - 10.5 K/uL   RBC 4.48 3.87 - 5.11 MIL/uL   Hemoglobin 12.2 12.0 - 15.0 g/dL   HCT 37.7 36.0 - 46.0 %   MCV 84.2 80.0 - 100.0 fL   MCH 27.2 26.0 - 34.0 pg   MCHC 32.4 30.0 - 36.0 g/dL   RDW 14.2 11.5 - 15.5 %   Platelets 322 150 - 400 K/uL   nRBC 0.0 0.0 - 0.2 %  Basic metabolic panel per protocol     Status: Abnormal   Collection Time: 02/20/21  7:30 AM  Result Value Ref Range   Sodium 141 135 - 145 mmol/L   Potassium 3.5 3.5 - 5.1 mmol/L   Chloride 109 98 - 111 mmol/L   CO2 25 22 - 32 mmol/L   Glucose, Bld 110 (H) 70 - 99 mg/dL   BUN 12 8 - 23 mg/dL   Creatinine, Ser 0.66 0.44 - 1.00 mg/dL   Calcium 9.6 8.9 - 10.3 mg/dL   GFR, Estimated >60 >60 mL/min   Anion gap 7 5 - 15   RCR Lungs clear  Pelvic US 11/22/2020: T/V and T/A images.  Anteverted uterus inhomogenous myometrium with no definite mass seen.  The uterus is measured at 9.83 x 4.3 x 5.61 cm.  The endometrium is difficult to evaluate due to position of the uterus and patient's body habitus but is measured at 4 to 5 mm with no obvious mass or abnormal blood flow.  Neither ovary is positively identified, but no adnexal mass or free fluid is seen abdominally or vaginally.    Assessment/Plan:  65 y.o. [id number]    1. Postmenopausal bleeding PMB on no HRT.  Pelvic US showed a thickened endometrium, therefore an EBx was done.  The specimen was minute, showing simple Hyperplasia without atypia.  Given that the specimen was so small, it may not be representative and a more significant lesion that is precancer or cancer could have been missed.  For that reason we are planning a hysteroscopy with MyoSure excision as needed and dilation and  curettage.  Preop preparation reviewed with Bashan.  Fasting for 6 hours before the procedure recommended.  Surgical procedure with risks thoroughly reviewed with patient.  Patient informed of the risk of perforation of the uterus.  Antibiotics IV will be given and patient will wear PAS to help circulation and avoid blood clots.  Postop precautions and expectations reviewed.  Patient voiced understanding and agreement with plan.   2. Thickened endometrium EBX done.   3. Simple endometrial hyperplasia without atypia  Minute specimen of  simple endometrial hyperplasia without atypia.                         Patient was counseled as to the risk of surgery to include the following:   1. Infection (prohylactic antibiotics will be administered)   2. DVT/Pulmonary Embolism (prophylactic pneumo compression stockings will be used)   3.Trauma to internal organs requiring additional surgical procedure to repair any injury to internal organs requiring perhaps additional hospitalization days.   4.Hemmorhage requiring transfusion and blood products which carry risks such as anaphylactic reaction, hepatitis and AIDS   Patient had received literature information on the procedure scheduled and all her questions were answered and fully accepts all risk.     Marie-Lyne Takya Vandivier 02/20/2021, 8:10 AM

## 2021-02-20 NOTE — Op Note (Addendum)
Operative Note  02/20/2021  8:56 AM  PATIENT:  Samantha [name]  65 y.o. female  PRE-OPERATIVE DIAGNOSIS:  Postmenopausal bleeding, endometrial simple hyperplasia with no atypia  POST-OPERATIVE DIAGNOSIS:  Postmenopausal bleeding, Endometrial simple hyperplasia with no atypia, Endometrial Polyps.  PROCEDURE:  Procedure(s): DILATATION & CURETTAGE/HYSTEROSCOPY WITH MYOSURE EXCISIONS  SURGEON:  Surgeon(s): [name], MD  ANESTHESIA:   general  FINDINGS: Endometrial Polyps at the lower anterior endometrial cavity, areas of thickened Endometrium  DESCRIPTION OF OPERATION: Under general anesthesia with laryngeal mask, the patient is in lithotomy position.  She is prepped with Betadine on the suprapubic, vulvar and vaginal areas.  Patient had emptied her bladder just prior to entering the operating room.  Draped as usual.  Timeout was done.  The vaginal exam revealed an anteverted uterus, normal volume and mobile.  No adnexal mass.  No current bleeding.  The speculum was inserted in the vagina and the anterior lip of the cervix was grasped with a tenaculum.  A paracervical block was done with lidocaine 1% a total of 20 cc at 4 and 8:00.  Dilation of the cervix with Pratt dilators up to #19 without difficulty.  Insertion of the hysteroscope in the intra uterine cavity.  Inspection reveals 2 normal ostia, the cavity is slightly vascular and irregular from either areas of endometrial thickenings or residual submucosal fibroids.  2 polyps are present at the lower anterior wall of the cavity.  Pictures were taken.  The light MyoSure was inserted.  All intra uterine lesions were excised.  Pictures were taken after excisions.  Hemostasis was adequate.  The hysteroscope with MyoSure were removed.  A systematic curettage of the intra uterine cavity on all surfaces was done with a sharp curette.  The curette was removed from the uterus.  Both the MyoSure excision specimen and the endometrial curettings  specimen were sent together to pathology.  The tenaculum was removed from the cervix.  Hemostasis was adequate.  The speculum was removed.  The patient was brought to recovery room in good and stable status.  ESTIMATED BLOOD LOSS: 5 cc FLUID DEFICIT: 185 cc  No intake or output data in the 24 hours ending 02/20/21 0856   BLOOD ADMINISTERED:none   LOCAL MEDICATIONS USED:  LIDOCAINE 1% 20 cc for Paracervical block  SPECIMEN:  Source of Specimen:  Excision material and endometrial curettings  DISPOSITION OF SPECIMEN:  PATHOLOGY  COUNTS:  YES  PLAN OF CARE: Transfer to PACU  Marie-Lyne LavoieMD8:56 AM

## 2021-02-21 ENCOUNTER — Encounter (HOSPITAL_BASED_OUTPATIENT_CLINIC_OR_DEPARTMENT_OTHER): Payer: Self-pay | Admitting: Obstetrics & Gynecology

## 2021-02-21 LAB — SURGICAL PATHOLOGY

## 2021-03-04 ENCOUNTER — Encounter: Payer: Self-pay | Admitting: Obstetrics & Gynecology

## 2021-03-04 ENCOUNTER — Other Ambulatory Visit: Payer: Self-pay

## 2021-03-04 ENCOUNTER — Ambulatory Visit (INDEPENDENT_AMBULATORY_CARE_PROVIDER_SITE_OTHER): Payer: Medicare HMO | Admitting: Obstetrics & Gynecology

## 2021-03-04 VITALS — BP 118/78 | HR 104

## 2021-03-04 DIAGNOSIS — Z09 Encounter for follow-up examination after completed treatment for conditions other than malignant neoplasm: Secondary | ICD-10-CM

## 2021-03-04 DIAGNOSIS — N8501 Benign endometrial hyperplasia: Secondary | ICD-10-CM

## 2021-03-04 MED ORDER — NORETHINDRONE 0.35 MG PO TABS: 1.0000 | ORAL_TABLET | Freq: Every day | ORAL | 4 refills | Status: DC

## 2021-03-04 NOTE — Progress Notes (Signed)
    Samantha [name] 12-30-1955 [id number]        65 y.o.  [id number]   RP: Postop HSC/Myosure Excision/D+C on 02/20/2021  HPI: Had postop spotting x 4-5 days, none currently.  No vaginal d/c.  No pelvic pain.  No fever.  Urine/BMs normal.    OB History  Gravida Para Term Preterm AB Living  6 3     2 3   SAB IAB Ectopic Multiple Live Births  1   0        # Outcome Date GA Lbr Len/2nd Weight Sex Delivery Anes PTL Lv  6 Gravida           5 Para           4 Para           3 Para           2 AB           1 SAB             Past medical history,surgical history, problem list, medications, allergies, family history and social history were all reviewed and documented in the EPIC chart.   Directed ROS with pertinent positives and negatives documented in the history of present illness/assessment and plan.  Exam:  Vitals:   03/04/21 1439  BP: 118/78  Pulse: (!) 104  SpO2: 96%   General appearance:  Normal  Abdomen: Normal  Gynecologic exam: Vulva normal.  Bimanual exam:  Uterus AV, normal volume, mobile, NT.  No adnexal mass, NT.  Patho 02/20/2021:   Clinical History: Postmenopausal bleeding, endometrial simple  hyperplasia with no atypia (crm)    FINAL MICROSCOPIC DIAGNOSIS:   A. ENDOMETRIUM, POLYP, CURETTAGE:  -  Benign endometrial polyp  -  Focal non-atypical hyperplasia  -  No malignancy identified    Assessment/Plan:  65 y.o. [id number]   1. Status post gynecological surgery, follow-up exam Excellent postop healing.  No Cx.  Pathology showing a benign endometrial polyp and Focal non-atypical hyperplasia.  Patient had that Dx in the past.  Reassured that it is not a pre-cancer lesion.  None-the-less, recommend opposing Estrogen effect associated with Obesity.  Decision to start on the Progestin-only BCPs.  Usage reviewed and prescription sent to pharmacy.  2. Simple endometrial hyperplasia without atypia Counseling done.  Start on the Progestin-only pill.  Other  orders - acetaminophen (TYLENOL) 325 MG tablet; 1 tablet - pantoprazole (PROTONIX) 40 MG tablet; 1 tablet - norethindrone (MICRONOR) 0.35 MG tablet; Take 1 tablet (0.35 mg total) by mouth daily.   [name] MD, 3:15 PM 03/04/2021

## 2021-03-05 ENCOUNTER — Encounter: Payer: Self-pay | Admitting: Obstetrics & Gynecology

## 2021-03-25 ENCOUNTER — Telehealth: Payer: Self-pay | Admitting: *Deleted

## 2021-03-25 DIAGNOSIS — N950 Postmenopausal bleeding: Secondary | ICD-10-CM

## 2021-03-25 MED ORDER — MEGESTROL ACETATE 40 MG PO TABS: 40.0000 mg | ORAL_TABLET | Freq: Two times a day (BID) | ORAL | 1 refills | Status: DC

## 2021-03-25 NOTE — Telephone Encounter (Signed)
Dr.Lavoie patient asked does she still take Micronor 0.35 mg while taking megace?

## 2021-03-25 NOTE — Telephone Encounter (Signed)
Patient called had D&C on 02/20/21 reports last week she started bleeding again, takes Micronor 0.35 mg tablet, wearing thin pad, reports this am the bleeding is heavier and filled the pad completely when she woke up. Also having cramping, the bleeding doesn't appear to be has heavy as it was when she woke up this morning.  Patient would like recommends. Please advise

## 2021-03-25 NOTE — Telephone Encounter (Signed)
Per Dr.Lavoie "Schedule with me with a Pelvic US.  Start on Megace 40 mg PO BID.  #60, refill x 1. "  Patient aware, Rx sent. Message sent to appointments to schedule.

## 2021-03-25 NOTE — Telephone Encounter (Signed)
Patient informed. 

## 2021-04-17 ENCOUNTER — Other Ambulatory Visit: Payer: Self-pay | Admitting: Obstetrics & Gynecology

## 2021-05-02 ENCOUNTER — Other Ambulatory Visit: Payer: Medicare HMO | Admitting: Obstetrics & Gynecology

## 2021-05-02 ENCOUNTER — Other Ambulatory Visit: Payer: Medicare HMO

## 2021-05-08 DIAGNOSIS — I82409 Acute embolism and thrombosis of unspecified deep veins of unspecified lower extremity: Secondary | ICD-10-CM

## 2021-05-08 HISTORY — DX: Acute embolism and thrombosis of unspecified deep veins of unspecified lower extremity: I82.409

## 2021-05-30 ENCOUNTER — Other Ambulatory Visit (HOSPITAL_COMMUNITY): Payer: Self-pay | Admitting: Family Medicine

## 2021-05-30 ENCOUNTER — Other Ambulatory Visit: Payer: Self-pay

## 2021-05-30 ENCOUNTER — Other Ambulatory Visit: Payer: Self-pay | Admitting: Family Medicine

## 2021-05-30 ENCOUNTER — Ambulatory Visit (HOSPITAL_BASED_OUTPATIENT_CLINIC_OR_DEPARTMENT_OTHER)
Admission: RE | Admit: 2021-05-30 | Discharge: 2021-05-30 | Disposition: A | Discharge status: Home / Self Care | Payer: No Typology Code available for payment source | Source: Ambulatory Visit | Attending: Family Medicine | Admitting: Family Medicine

## 2021-05-30 DIAGNOSIS — M79605 Pain in left leg: Secondary | ICD-10-CM | POA: Diagnosis not present

## 2021-05-30 DIAGNOSIS — I82452 Acute embolism and thrombosis of left peroneal vein: Secondary | ICD-10-CM | POA: Diagnosis not present

## 2021-05-30 DIAGNOSIS — I82432 Acute embolism and thrombosis of left popliteal vein: Secondary | ICD-10-CM | POA: Diagnosis not present

## 2021-05-30 DIAGNOSIS — M7989 Other specified soft tissue disorders: Secondary | ICD-10-CM | POA: Diagnosis not present

## 2021-06-05 ENCOUNTER — Other Ambulatory Visit: Payer: Self-pay | Admitting: Obstetrics & Gynecology

## 2021-06-14 DIAGNOSIS — I82402 Acute embolism and thrombosis of unspecified deep veins of left lower extremity: Secondary | ICD-10-CM | POA: Diagnosis not present

## 2021-06-14 DIAGNOSIS — E2839 Other primary ovarian failure: Secondary | ICD-10-CM | POA: Diagnosis not present

## 2021-06-20 ENCOUNTER — Ambulatory Visit: Payer: No Typology Code available for payment source | Admitting: Obstetrics & Gynecology

## 2021-06-20 ENCOUNTER — Encounter: Payer: Self-pay | Admitting: Obstetrics & Gynecology

## 2021-06-20 ENCOUNTER — Other Ambulatory Visit: Payer: Self-pay | Admitting: Obstetrics & Gynecology

## 2021-06-20 ENCOUNTER — Other Ambulatory Visit: Payer: Self-pay

## 2021-06-20 ENCOUNTER — Telehealth: Payer: Self-pay | Admitting: *Deleted

## 2021-06-20 ENCOUNTER — Ambulatory Visit (INDEPENDENT_AMBULATORY_CARE_PROVIDER_SITE_OTHER): Payer: No Typology Code available for payment source

## 2021-06-20 ENCOUNTER — Other Ambulatory Visit (HOSPITAL_COMMUNITY)
Admission: RE | Admit: 2021-06-20 | Discharge: 2021-06-20 | Disposition: A | Discharge status: Home / Self Care | Payer: No Typology Code available for payment source | Source: Ambulatory Visit | Attending: Obstetrics & Gynecology | Admitting: Obstetrics & Gynecology

## 2021-06-20 VITALS — BP 118/76

## 2021-06-20 DIAGNOSIS — N950 Postmenopausal bleeding: Secondary | ICD-10-CM

## 2021-06-20 DIAGNOSIS — R9389 Abnormal findings on diagnostic imaging of other specified body structures: Secondary | ICD-10-CM | POA: Diagnosis not present

## 2021-06-20 DIAGNOSIS — N8501 Benign endometrial hyperplasia: Secondary | ICD-10-CM | POA: Insufficient documentation

## 2021-06-20 MED ORDER — MEGESTROL ACETATE 40 MG PO TABS: 40.0000 mg | ORAL_TABLET | Freq: Two times a day (BID) | ORAL | 2 refills | Status: DC

## 2021-06-20 NOTE — Telephone Encounter (Signed)
-----   Message from [name], MD sent at 06/20/2021  3:47 PM EDT ----- ?Regarding: Schedule surgery ?Surgery: CPT 407 012 8796 - XI Robotic Total Laparoscopic Hysterectomy with Salpingectomy and Bilateral Oophorectomy ? ?Diagnosis: N95.0 Postmenopausal Bleeding, R93.89 Thickened Endometrium, h/o Hyperplasia without atypia ? ?Location: [place] ? ?Status: Outpatient with Overnight Bed ? ?Time: 120 Minutes ? ?Assistant: First Available Provider ? ?Urgency: First Available ? ?Pre-Op Appointment: To Be Scheduled ? ?Post-Op Appointment(s): 2 Weeks, 6 Weeks ? ?Time Out Of Work: 6 Weeks  ? ?

## 2021-06-20 NOTE — Progress Notes (Signed)
? ? ?RICKIA FREEBURG May 07, 1955 [id number] ? ? ?     66 y.o.  [id number]  ? ?RP:  Recurrent PMB  ? ?HPI: S/P HSC/Myosure Excision/D+C on 02/20/2021.  Patho 02/20/2021:  Benign endometrial polyp and Focal non-atypical hyperplasia. No malignancy identified.  Stopped the Progestin only pill.  Frequent vaginal bleeding, at times heavy.  Megace on-off.  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 ? ? ?OB History  ?Gravida Para Term Preterm AB Living  ?'6 3     2 3  '$ ?SAB IAB Ectopic Multiple Live Births  ?1   0      ?  ?# Outcome Date GA Lbr Len/2nd Weight Sex Delivery Anes PTL Lv  ?6 Gravida           ?5 Para           ?4 Para           ?3 Para           ?2 AB           ?1 SAB           ? ? ?Past medical history,surgical history, problem list, medications, allergies, family history and social history were all reviewed and documented in the EPIC chart. ? ? ?Directed ROS with pertinent positives and negatives documented in the history of present illness/assessment and plan. ? ?Exam: ? ?Vitals:  ? 06/20/21 1515  ?BP: 118/76  ? ?General appearance:  Normal ? ?Pelvic US today: T/V and T/a images.  Anteverted enlarged uterus with 2 definitive fibroids visualized.  The overall uterine size is measured at 10.1 x 7.78 x 6.79 cm.  1 anterior subserosal fibroid is measured at 2.2 x 1.5 cm and another fibroid in the lower uterine segment is measured at 1.4 x 1.6 cm.  The endometrial lining is secretory in appearance and measures approximately 9.6 mm.  Trace amount of fluid with debris are seen in the cavity.  No obvious mass or abnormal vascularity is seen.  Right ovary is atrophic in appearance within normal limits.  The left ovary is not well visualized transvaginally or transabdominally.  No adnexal mass seen.  No free fluid in the pelvis. ? ?Abdomen: Normal ? ?Informed consent signed for EBx.  Speculum:  Cervix/vagina normal with dark blood present.  Betadine prep.  Hurricane spray.  Tenaculum on anterior lip of cervix.  Os finder.  Endometrial biopsy  with good amount of tissue.  All instruments removed.  Specimen sent to pathology. ? ? ?Assessment/Plan:  66 y.o. [id number]  ? ?1. Postmenopausal bleeding ?S/P HSC/Myosure Excision/D+C on 02/20/2021.  Patho 02/20/2021:  Benign endometrial polyp and Focal non-atypical hyperplasia. No malignancy identified.  Stopped the Progestin only pill.  Frequent vaginal bleeding, at times heavy.  Megace on-off.  H/O TIAs and DVT on Xeralto.  Pelvic ultrasound findings currently reviewed with patient.  The endometrial lining is thickened at 9.6 mm.  An endometrial biopsy was taken.  Pending pathology.  Given the recurrence of the PMB/Thickened Endometrium and Hyperplasia (Simple without atypia), decision to proceed with XI Robotic Total Laparoscopic Hysterectomy with Salpingectomy and Bilateral Oophorectomy.  F/U Preop visit. ?- Surgical pathology( [place]/ [facility]) ? ?2. Thickened endometrium ?Endometrial biopsy done, pending pathology.- Surgical pathology( [place]/ [facility]) ? ?3. Simple endometrial hyperplasia without atypia ?Endometrial biopsy done today. ?- Surgical pathology( [place]/ [facility]) ? ?Other orders ?- [place]; Take by mouth. ?- megestrol (MEGACE) 40 MG tablet; Take 1 tablet (40 mg total) by mouth 2 (  two) times daily.  ? ?[name] MD, 3:30 PM 06/20/2021 ? ? ? ?  ?

## 2021-06-24 LAB — SURGICAL PATHOLOGY

## 2021-06-24 NOTE — Telephone Encounter (Signed)
Spoke with patient. Reviewed surgery dates. Patient request to proceed with surgery on 07/30/21.  Advised patient I will forward to business office for return call. I will return call once surgery date and time confirmed. Patient verbalizes understanding and is agreeable.  ? ?Surgery request sent.  ? ?

## 2021-06-24 NOTE — Telephone Encounter (Signed)
Call to patient. Per DPR, OK to leave message on voicemail.   Left voicemail requesting a return call to review benefits for Scheduled Surgery with Marie-Lyne Lavoie, MD, FACOG, FRCSC, MA.  

## 2021-06-24 NOTE — Telephone Encounter (Signed)
Spoke with patient, advised per Dr. Dellis Filbert.  ?Patient will stop Megace as advised. Medication list updated.  ?Pre-op scheduled for 07/18/21 at 4:30 pm. Patient aware I will return call once surgery date/time confirmed.  ?Patient verbalizes understanding and is agreeable.  ?

## 2021-06-24 NOTE — Telephone Encounter (Addendum)
-----   Message ----- ?From: [name], MD ?Sent: 06/24/2021  11:23 AM EDT ?To: Ramond Craver, RMA ?Subject: Megace                                        ? ?[name], patient's Endometrial Bx is limited in amount, but benign.  We will go forward with surgery.  Could you tell her to stop the Megace given her h/o DVT on Xeralto?  Schedule preop visit with me asap.   ? ? ?

## 2021-06-28 ENCOUNTER — Encounter: Payer: Self-pay | Admitting: Obstetrics & Gynecology

## 2021-06-28 NOTE — Telephone Encounter (Signed)
Spoke with patient. Surgery date request confirmed.  ?Advised surgery is scheduled for [facility] on 07/30/21, 11am.  ?Surgery instruction sheet and hospital brochure reviewed, printed copy will be mailed.  ?Patient verbalizes understanding and is agreeable.  ? ? ?Dr. Dellis Filbert -please advise on Xarelto pre-op instructions?  ? ? ?

## 2021-07-02 NOTE — Telephone Encounter (Signed)
[name], MD  You 4 minutes ago (10:44 AM)  ? ?Don't take Alen Blew the day before surgery and the day of surgery.   ? ?Spoke with patient, advised per Dr. Dellis Filbert. Patient read back instructions, verbalizes understanding and is agreeable.  ? ?Routing to [employer]. OK to close encounter once benefits reviewed.  ?

## 2021-07-04 NOTE — Telephone Encounter (Signed)
Spoke with patient regarding surgery benefits. Patient acknowledges understanding of information presented. Patient is aware that benefits presented are professional benefits only. Patient is aware the hospital will call with facility benefits. See account note. ? ?Encounter closed. ?

## 2021-07-10 ENCOUNTER — Encounter (HOSPITAL_BASED_OUTPATIENT_CLINIC_OR_DEPARTMENT_OTHER): Payer: Self-pay | Admitting: Obstetrics & Gynecology

## 2021-07-11 ENCOUNTER — Telehealth: Payer: Self-pay | Admitting: *Deleted

## 2021-07-11 NOTE — Telephone Encounter (Signed)
Fax received from [facility]/ Dr. Stephanie Acre regarding Xarelto. "Patient needs to be anticoagulated/ on Xarelto for at least 3 months prior to major surgery". Placed on Dr. Assunta Curtis desk for review. Patient is scheduled for surgery on 4/25.  ? ? ?Dr. Dellis Filbert -please review and advise.  ?

## 2021-07-11 NOTE — Telephone Encounter (Signed)
-----   Message from Casandra Doffing, RN sent at 07/10/2021  3:55 PM EDT ----- ?Regarding: Xarelto ?Hi Howard Patton, ?I saw your 06/20/21 note in Epic about the above patient being advised to stop taking Xarelto the day before and the day of surgery by Dr. Dellis Filbert. Per our anesthesia guidelines, we need documentation from the prescribing provider giving instructions on the stopping of blood thinners for surgery. If Dr. Dellis Filbert did not prescribe the Xarelto, will you please get instructions from the prescribing provider so I can place it in her chart for anesthesia? Our fax # is [phone]. If you have any questions, please call me at [phone]. I will be out of the office until Monday, April 10th. ? ?Thank you very much! ?Lavenia Atlas, RN ?[facility] ?Pre-surgical testing ? ?

## 2021-07-11 NOTE — Telephone Encounter (Signed)
Spoke with patient. Confirmed PCP, Dr. Stephanie Acre prescribes Xarelto. Advised patient I will contact PCP for instructions for Xarelto and return call once received. Patient agreeable and verbalizes understanding. ? ? ?Request faxed to PCP Dr. Stephanie Acre at [phone] requesting instructions for Xarelto. Robot TLH, BSO on 4/25.  ?

## 2021-07-16 ENCOUNTER — Encounter: Payer: Self-pay | Admitting: Obstetrics & Gynecology

## 2021-07-16 NOTE — Telephone Encounter (Signed)
Samantha Bruins, MD  You 3 days ago  ? ?Please verify when we will reach the 3 months on Xeralto and can schedule surgery at that time.  Will cover with Subcutaneous Heparin during surgery.   ? ?

## 2021-07-16 NOTE — Telephone Encounter (Signed)
Spoke with patient, advised as seen below per Dr. Dellis Filbert. Patient states she is unsure when she started Xarelto, will contact PCP and pharmacy to confirm and return call with update. Will reschedule at that time.  ?

## 2021-07-16 NOTE — Telephone Encounter (Signed)
Fax request sent to Dr. Stephanie Acre at [phone] updating on surgery reschedule. Request pre-op instructions for Xarelto prior to surgery.  ?

## 2021-07-16 NOTE — Telephone Encounter (Signed)
Spoke with patient.  ?Patient started Xarelto 05/31/21.  ?Reviewed surgery dates, will reschedule to 09/11/21. Pre and Post-op appts rescheduled. Advised patient I will return call to confirm time once rescheduled. Patient verbalizes understanding and is agreeable.  ? ?Call placed to central scheduling, spoke with Colletta Maryland, case r/s to 6/7 at 12pm.  ? ?[facility] PAT notified.  ?

## 2021-07-18 ENCOUNTER — Encounter: Payer: No Typology Code available for payment source | Admitting: Obstetrics & Gynecology

## 2021-07-22 NOTE — Telephone Encounter (Signed)
Spoke with patient. Surgery date request confirmed.  ?Advised surgery is scheduled for 09/11/21, [facility] at 12pm. ?Surgery instruction sheet and hospital brochure reviewed, printed copy will be mailed.  ?Dr. Dellis Filbert will review Xarelto at Pre-op visit.  ?Patient verbalizes understanding and is agreeable.  ?Routing to provider. Encounter closed.  ? ? ? ?Routing to provider for final review. Patient is agreeable to disposition. Will close encounter. ? ?Cc: Hayley Carder ? ?

## 2021-07-26 ENCOUNTER — Encounter (HOSPITAL_COMMUNITY): Admission: RE | Admit: 2021-07-26 | Payer: No Typology Code available for payment source | Source: Ambulatory Visit

## 2021-07-30 DIAGNOSIS — Z01818 Encounter for other preprocedural examination: Secondary | ICD-10-CM

## 2021-07-30 DIAGNOSIS — I10 Essential (primary) hypertension: Secondary | ICD-10-CM

## 2021-08-14 ENCOUNTER — Encounter: Payer: Self-pay | Admitting: Obstetrics & Gynecology

## 2021-08-14 ENCOUNTER — Ambulatory Visit (INDEPENDENT_AMBULATORY_CARE_PROVIDER_SITE_OTHER): Payer: No Typology Code available for payment source | Admitting: Obstetrics & Gynecology

## 2021-08-14 VITALS — BP 116/70 | HR 70 | Resp 16 | Ht 63.0 in | Wt 247.0 lb

## 2021-08-14 DIAGNOSIS — D219 Benign neoplasm of connective and other soft tissue, unspecified: Secondary | ICD-10-CM | POA: Diagnosis not present

## 2021-08-14 DIAGNOSIS — N8501 Benign endometrial hyperplasia: Secondary | ICD-10-CM

## 2021-08-14 DIAGNOSIS — R9389 Abnormal findings on diagnostic imaging of other specified body structures: Secondary | ICD-10-CM | POA: Diagnosis not present

## 2021-08-14 DIAGNOSIS — N950 Postmenopausal bleeding: Secondary | ICD-10-CM

## 2021-08-14 NOTE — Progress Notes (Signed)
? ? ?Samantha [name] [birth year] [id number] ? ? ?     66 y.o.  [id number]  ? ?RP: Preop XI Robotic TLH/BSO on 09/11/2021 ? ?HPI: S/P HSC/Myosure Excision/D+C on 02/20/2021.  Patho 02/20/2021:  Benign endometrial polyp and Focal non-atypical hyperplasia. No malignancy identified.  Stopped the Progestin only pill. Frequent vaginal bleeding, at times heavy.  Megace on-off.  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 ? ? ?OB History  ?Gravida Para Term Preterm AB Living  ?'6 3     2 3  '$ ?SAB IAB Ectopic Multiple Live Births  ?1   0      ?  ?# Outcome Date GA Lbr Len/2nd Weight Sex Delivery Anes PTL Lv  ?6 Gravida           ?5 Para           ?4 Para           ?3 Para           ?2 AB           ?1 SAB           ? ? ?Past medical history,surgical history, problem list, medications, allergies, family history and social history were all reviewed and documented in the EPIC chart. ? ? ?Directed ROS with pertinent positives and negatives documented in the history of present illness/assessment and plan. ? ?Exam: ? ?Vitals:  ? 08/14/21 1458  ?BP: 116/70  ?Pulse: 70  ?Resp: 16  ?Weight: 247 lb (112 kg)  ?Height: '5\' 3"'$  (1.6 m)  ? ?General appearance:  Normal ? ?Pelvic US 06/20/2021: T/V and T/a images.  Anteverted enlarged uterus with 2 definitive fibroids visualized.  The overall uterine size is measured at 10.1 x 7.78 x 6.79 cm.  1 anterior subserosal fibroid is measured at 2.2 x 1.5 cm and another fibroid in the lower uterine segment is measured at 1.4 x 1.6 cm.  The endometrial lining is secretory in appearance and measures approximately 9.6 mm.  Trace amount of fluid with debris are seen in the cavity.  No obvious mass or abnormal vascularity is seen.  Right ovary is atrophic in appearance within normal limits.  The left ovary is not well visualized transvaginally or transabdominally.  No adnexal mass seen.  No free fluid in the pelvis. ? ? ?Assessment/Plan:  66 y.o. [id number]  ? ?1. Recurrent Postmenopausal bleeding ?S/P HSC/Myosure Excision/D+C on  02/20/2021.  Patho 02/20/2021:  Benign endometrial polyp and Focal non-atypical hyperplasia. No malignancy identified.  Stopped the Progestin only pill. Frequent vaginal bleeding, at times heavy.  Megace on-off.  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  Decision to proceed with an XI Robotic TLH/BSO.  Will give Prophylaxis anticoagulant.  Preop preparation, surgery and risks, as well as postop precautions and expectations reviewed thoroughly.  Patient voiced understanding and agreement with the plan. ? ?2. Fibroids ? ?3. Thickened endometrium ?Endometrial line 9.6 mm on Pelvic US in 06/2021. ? ?4. Simple endometrial hyperplasia without atypia ? ?Other orders ?- XARELTO 20 MG TABS tablet; Take 20 mg by mouth daily. ?- Cholecalciferol (D3) 50 MCG (2000 UT) TABS ?- Iron, Ferrous Sulfate, 325 (65 Fe) MG TABS  ? ?                      Patient was counseled as to the risk of surgery to include the following: ? ?1. Infection (prohylactic antibiotics will be administered) ? ?2. DVT/Pulmonary Embolism (prophylactic pneumo compression stockings will be used) ? ?3.Trauma to internal  organs requiring additional surgical procedure to repair any injury to internal organs requiring perhaps additional hospitalization days. ? ?4.Hemmorhage requiring transfusion and blood products which carry risks such as anaphylactic reaction, hepatitis and AIDS ? ?Patient had received literature information on the procedure scheduled and all her questions were answered and fully accepts all risk. ? ?  ?[name] MD, 3:43 PM 08/14/2021 ? ? ? ?  ?

## 2021-09-03 ENCOUNTER — Encounter (HOSPITAL_BASED_OUTPATIENT_CLINIC_OR_DEPARTMENT_OTHER): Payer: Self-pay | Admitting: Obstetrics & Gynecology

## 2021-09-03 ENCOUNTER — Other Ambulatory Visit: Payer: Self-pay

## 2021-09-03 NOTE — Progress Notes (Signed)
Spoke w/ via phone for pre-op interview---Samantha [name] needs dos----urine pregnancy per surgeon                [name] results------09/09/21 [name] appt for CBC, type & screen, BMP, serum creatinine COVID test -----patient states asymptomatic no test needed Arrive at -------1000 on Wednesday, 09/11/21 NPO after MN NO Solid Food.  Clear liquids from MN until---0900 Med rec completed Medications to take morning of surgery -----Albuterol inhaler, Zyrtec, Protonix Patient given instructions not to take Xarelto on the day before and the day of surgery by Dr. Dellis Filbert per pt. As of 09/03/21, waiting for instructions from PCP, Dr. Stephanie Acre for Xarelto. Diabetic medication -----n/a Patient instructed no nail polish to be worn day of surgery Patient instructed to bring photo id and insurance card day of surgery Patient aware to have Driver (ride ) / caregiver    for 24 hours after surgery - husband, Samantha [name] Patient Special Instructions -----Bring inhaler. Extended / overnight stay instructions given. Pre-Op special Istructions -----none Patient verbalized understanding of instructions that were given at this phone interview. Patient denies shortness of breath, chest pain, fever, cough at this phone interview.   Patient has a hx of HTN, COPD w/mild emphysema, asthma; hx of TIA age mid 18's, none since; DVT in 05/2021, currently on Xarelto, pt stated last COPD flare was last year, she does get SOB w/ exertion.No oxygen.Patient states that she is able to do things around the house w/o getting SOB. Last pulmonary OV with Dr. Ander Slade 12/26/20 in [place]. 05/23/20 PFT in Epic 02/20/21 EKG in Epic 12/22/20 Chest CT in Epic LOV with PCP, Dr. Stephanie Acre 07/2021 per patient. Patient was originally scheduled for hysterectomy on 07/30/21. PCP, Dr. Stephanie Acre wanted the patient to be on Xarelto at least 3 months before a major surgery, see 07/11/21 telephone encounter by Glorianne Manchester, RN in [place]. On 09/03/21 I contacted Glorianne Manchester, RN via Epic IB and  requested instructions for Xarelto from Dr. Stephanie Acre for 09/11/21 surgery. Awaiting instructions.  Addendum: See telephone encounter dated 09/05/21 in Epic & chart. Per 09/05/21 note from Glorianne Manchester, RN, Dr. Stephanie Acre instructed patient to stop Xarelto 5 days prior to surgery.

## 2021-09-03 NOTE — Progress Notes (Signed)
Your procedure is scheduled on Wednesday, 09/11/2021.  Report to [facility].   Call this number if you have problems the morning of surgery  [phone].   OUR ADDRESS IS [address].  WE ARE LOCATED IN [facility].  PLEASE BRING YOUR INSURANCE CARD AND PHOTO ID DAY OF SURGERY.  ONLY 2 PEOPLE ARE ALLOWED IN  WAITING  ROOM.                                      REMEMBER:  DO NOT EAT FOOD, CANDY GUM OR MINTS  AFTER MIDNIGHT THE NIGHT BEFORE YOUR SURGERY . YOU MAY HAVE CLEAR LIQUIDS FROM MIDNIGHT THE NIGHT BEFORE YOUR SURGERY UNTIL  9:00 AM. NO CLEAR LIQUIDS AFTER  9:00 AM DAY OF SURGERY.  YOU MAY  BRUSH YOUR TEETH MORNING OF SURGERY AND RINSE YOUR MOUTH OUT, NO CHEWING GUM CANDY OR MINTS.     CLEAR LIQUID DIET   Foods Allowed                                                                     Foods Excluded  Coffee and tea, regular and decaf                             liquids that you cannot  Plain Jell-O                                                                   see through such as: Fruit ices (not with fruit pulp)                                     milk, soups, orange juice  Plain  Popsicles                                    All solid food Carbonated beverages, regular and diet                                    Cranberry, grape and apple juices Sports drinks like Gatorade _____________________________________________________________________     TAKE THESE MEDICATIONS MORNING OF SURGERY: Albuterol inhaler, Zyrtec, Protonix  Follow instructions from Dr. Stephanie Acre on when to stop Xarelto.   UP TO 4 VISITORS  MAY VISIT IN THE EXTENDED RECOVERY ROOM UNTIL 800 PM ONLY.  1 VISITOR AGE 7 AND OVER MAY SPEND THE NIGHT AND MUST BE IN EXTENDED RECOVERY ROOM NO LATER THAN 8:00 PM . YOUR DISCHARGE TIME AFTER YOU SPEND THE NIGHT IS 9:00 AM THE MORNING AFTER YOUR SURGERY.  YOU MAY PACK  A SMALL OVERNIGHT BAG WITH  TOILETRIES FOR YOUR OVERNIGHT STAY IF YOU WISH.  YOUR PRESCRIPTION MEDICATIONS WILL BE PROVIDED DURING [place].                                      DO NOT WEAR JEWERLY, MAKE UP. DO NOT WEAR LOTIONS, POWDERS, PERFUMES OR NAIL POLISH ON YOUR FINGERNAILS. TOENAIL POLISH IS OK TO WEAR. DO NOT SHAVE FOR 48 HOURS PRIOR TO DAY OF SURGERY. MEN MAY SHAVE FACE AND NECK. CONTACTS, GLASSES, OR DENTURES MAY NOT BE WORN TO SURGERY.  REMEMBER: NO SMOKING, DRUGS OR ALCOHOL FOR 24 HOURS BEFORE YOUR SURGERY.                                    [facility] IS NOT RESPONSIBLE  FOR ANY BELONGINGS.                                                                    Marland Kitchen           [facility] - Preparing for Surgery Before surgery, you can play an important role.  Because skin is not sterile, your skin needs to be as free of germs as possible.  You can reduce the number of germs on your skin by washing with CHG (chlorahexidine gluconate) soap before surgery.  CHG is an antiseptic cleaner which kills germs and bonds with the skin to continue killing germs even after washing. Please DO NOT use if you have an allergy to CHG or antibacterial soaps.  If your skin becomes reddened/irritated stop using the CHG and inform your nurse when you arrive at Short Stay. Do not shave (including legs and underarms) for at least 48 hours prior to the first CHG shower.  You may shave your face/neck. Please follow these instructions carefully:  1.  Shower with CHG Soap the night before surgery and the  morning of Surgery.  2.  If you choose to wash your hair, wash your hair first as usual with your  normal  shampoo.  3.  After you shampoo, rinse your hair and body thoroughly to remove the  shampoo.                            4.  Use CHG as you would any other liquid soap.  You can apply chg directly  to the skin and wash , please wash your belly button thoroughly with chg soap provided night before and morning of your  surgery.                     Gently with a scrungie or clean washcloth.  5.  Apply the CHG Soap to your body ONLY FROM THE NECK DOWN.   Do not use on face/ open                           Wound or open sores. Avoid contact with eyes, ears mouth and genitals (private parts).  Wash face,  Genitals (private parts) with your normal soap.             6.  Wash thoroughly, paying special attention to the area where your surgery  will be performed.  7.  Thoroughly rinse your body with warm water from the neck down.  8.  DO NOT shower/wash with your normal soap after using and rinsing off  the CHG Soap.                9.  Pat yourself dry with a clean towel.            10.  Wear clean pajamas.            11.  Place clean sheets on your bed the night of your first shower and do not  sleep with pets. Day of Surgery : Do not apply any lotions/deodorants the morning of surgery.  Please wear clean clothes to the hospital/surgery center.  IF YOU HAVE ANY SKIN IRRITATION OR PROBLEMS WITH THE SURGICAL SOAP, PLEASE GET A BAR OF GOLD DIAL SOAP AND SHOWER THE NIGHT BEFORE YOUR SURGERY AND THE MORNING OF YOUR SURGERY. PLEASE LET THE NURSE KNOW MORNING OF YOUR SURGERY IF YOU HAD ANY PROBLEMS WITH THE SURGICAL SOAP.   ________________________________________________________________________                                                        QUESTIONS [facility] PRE OP NURSE PHONE [phone].

## 2021-09-05 ENCOUNTER — Telehealth: Payer: Self-pay | Admitting: *Deleted

## 2021-09-05 NOTE — Telephone Encounter (Signed)
Tahlia called from Dr. Stephanie Acre office.  Was advised patient has been provided instruction per Dr. Stephanie Acre to stop Xarelto 5 days prior to surgery.   See previous note, spoke with patient confirming.   Routing to Dr. Ileene Musa.   Encounter closed.

## 2021-09-05 NOTE — Telephone Encounter (Signed)
Spoke with patient.  Patient states she spoke with PCP on 09/04/21 and was instructed to stop Xarelto 5 days prior to surgery. Advised I also requested instructions from PCP to have on file. Questions answered. Patient verbalizes understanding and is agreeable.

## 2021-09-05 NOTE — Telephone Encounter (Signed)
-----   Message from Casandra Doffing, RN sent at 09/03/2021 12:26 PM EDT ----- Regarding: Samantha [name],  I see that [name] is back on our schedule for a hysterectomy. Previously she was scheduled in April. The prescribing provider had stated that she needed to be on Xarelto for 3 months prior to major surgery. If she has been on Xarelto for 3 months now, we should be good. We still need instructions from Dr. Stephanie Acre [facility]  for when she can stop taking Xarelto for this surgery. Will you please contact them and fax me the instructions so I can place in the chart? Thanks so much! Please call me if you have any questions at [phone]. Our fax # is [phone].   Lavenia Atlas, RN [facility] Pre-surgical testing

## 2021-09-05 NOTE — Telephone Encounter (Signed)
Fax sent to Dr. Stephanie Acre requesting instructions on Xarelto.

## 2021-09-06 ENCOUNTER — Other Ambulatory Visit: Payer: Self-pay | Admitting: Family Medicine

## 2021-09-06 ENCOUNTER — Encounter: Payer: No Typology Code available for payment source | Admitting: Obstetrics & Gynecology

## 2021-09-06 DIAGNOSIS — E2839 Other primary ovarian failure: Secondary | ICD-10-CM

## 2021-09-09 ENCOUNTER — Encounter (HOSPITAL_COMMUNITY)
Admission: RE | Admit: 2021-09-09 | Discharge: 2021-09-09 | Disposition: A | Discharge status: Home / Self Care | Payer: No Typology Code available for payment source | Source: Ambulatory Visit | Attending: Obstetrics & Gynecology | Admitting: Obstetrics & Gynecology

## 2021-09-09 DIAGNOSIS — Z01812 Encounter for preprocedural laboratory examination: Secondary | ICD-10-CM | POA: Diagnosis not present

## 2021-09-09 DIAGNOSIS — Z79899 Other long term (current) drug therapy: Secondary | ICD-10-CM | POA: Insufficient documentation

## 2021-09-09 DIAGNOSIS — Z5181 Encounter for therapeutic drug level monitoring: Secondary | ICD-10-CM | POA: Diagnosis not present

## 2021-09-09 DIAGNOSIS — Z01818 Encounter for other preprocedural examination: Secondary | ICD-10-CM

## 2021-09-09 LAB — CBC
HCT: 37.6 % (ref 36.0–46.0)
Hemoglobin: 11.9 g/dL — ABNORMAL LOW (ref 12.0–15.0)
MCH: 25.9 pg — ABNORMAL LOW (ref 26.0–34.0)
MCHC: 31.6 g/dL (ref 30.0–36.0)
MCV: 81.7 fL (ref 80.0–100.0)
Platelets: 334 10*3/uL (ref 150–400)
RBC: 4.6 MIL/uL (ref 3.87–5.11)
RDW: 14.5 % (ref 11.5–15.5)
WBC: 6 10*3/uL (ref 4.0–10.5)
nRBC: 0 % (ref 0.0–0.2)

## 2021-09-09 LAB — BASIC METABOLIC PANEL
Anion gap: 7 (ref 5–15)
BUN: 10 mg/dL (ref 8–23)
CO2: 26 mmol/L (ref 22–32)
Calcium: 9.8 mg/dL (ref 8.9–10.3)
Chloride: 107 mmol/L (ref 98–111)
Creatinine, Ser: 0.78 mg/dL (ref 0.44–1.00)
GFR, Estimated: >60 mL/min (ref >60)
Glucose, Bld: 83 mg/dL (ref 70–99)
Potassium: 3.9 mmol/L (ref 3.5–5.1)
Sodium: 140 mmol/L (ref 135–145)

## 2021-09-09 LAB — TYPE AND SCREEN
ABO/RH(D): B POS
Antibody Screen: NEGATIVE

## 2021-09-10 NOTE — Progress Notes (Signed)
Spoke with patient by phone, patient stopped xarelto 5 days before surgery per dr Myer Peer instructions, last dose was 09-05-2021 per patient.

## 2021-09-11 ENCOUNTER — Ambulatory Visit (HOSPITAL_BASED_OUTPATIENT_CLINIC_OR_DEPARTMENT_OTHER)
Admission: RE | Admit: 2021-09-11 | Discharge: 2021-09-12 | Disposition: A | Discharge status: Home / Self Care | Payer: No Typology Code available for payment source | Attending: Obstetrics & Gynecology | Admitting: Obstetrics & Gynecology

## 2021-09-11 ENCOUNTER — Ambulatory Visit (HOSPITAL_BASED_OUTPATIENT_CLINIC_OR_DEPARTMENT_OTHER): Payer: No Typology Code available for payment source | Admitting: Certified Registered Nurse Anesthetist

## 2021-09-11 ENCOUNTER — Encounter (HOSPITAL_BASED_OUTPATIENT_CLINIC_OR_DEPARTMENT_OTHER)
Admission: RE | Disposition: A | Discharge status: Home / Self Care | Payer: Self-pay | Source: Home / Self Care | Attending: Obstetrics & Gynecology

## 2021-09-11 ENCOUNTER — Encounter (HOSPITAL_BASED_OUTPATIENT_CLINIC_OR_DEPARTMENT_OTHER): Payer: Self-pay | Admitting: Obstetrics & Gynecology

## 2021-09-11 ENCOUNTER — Other Ambulatory Visit: Payer: Self-pay

## 2021-09-11 DIAGNOSIS — J439 Emphysema, unspecified: Secondary | ICD-10-CM | POA: Insufficient documentation

## 2021-09-11 DIAGNOSIS — D251 Intramural leiomyoma of uterus: Secondary | ICD-10-CM | POA: Diagnosis not present

## 2021-09-11 DIAGNOSIS — I10 Essential (primary) hypertension: Secondary | ICD-10-CM

## 2021-09-11 DIAGNOSIS — Z79899 Other long term (current) drug therapy: Secondary | ICD-10-CM | POA: Insufficient documentation

## 2021-09-11 DIAGNOSIS — M1990 Unspecified osteoarthritis, unspecified site: Secondary | ICD-10-CM | POA: Insufficient documentation

## 2021-09-11 DIAGNOSIS — Z87891 Personal history of nicotine dependence: Secondary | ICD-10-CM | POA: Insufficient documentation

## 2021-09-11 DIAGNOSIS — Z8673 Personal history of transient ischemic attack (TIA), and cerebral infarction without residual deficits: Secondary | ICD-10-CM | POA: Diagnosis not present

## 2021-09-11 DIAGNOSIS — Z6841 Body Mass Index (BMI) 40.0 and over, adult: Secondary | ICD-10-CM | POA: Insufficient documentation

## 2021-09-11 DIAGNOSIS — D219 Benign neoplasm of connective and other soft tissue, unspecified: Secondary | ICD-10-CM

## 2021-09-11 DIAGNOSIS — R9389 Abnormal findings on diagnostic imaging of other specified body structures: Secondary | ICD-10-CM

## 2021-09-11 DIAGNOSIS — Z9851 Tubal ligation status: Secondary | ICD-10-CM | POA: Diagnosis not present

## 2021-09-11 DIAGNOSIS — Z9889 Other specified postprocedural states: Secondary | ICD-10-CM

## 2021-09-11 DIAGNOSIS — N950 Postmenopausal bleeding: Secondary | ICD-10-CM

## 2021-09-11 DIAGNOSIS — D259 Leiomyoma of uterus, unspecified: Secondary | ICD-10-CM | POA: Diagnosis not present

## 2021-09-11 DIAGNOSIS — Z86718 Personal history of other venous thrombosis and embolism: Secondary | ICD-10-CM | POA: Insufficient documentation

## 2021-09-11 DIAGNOSIS — I82409 Acute embolism and thrombosis of unspecified deep veins of unspecified lower extremity: Secondary | ICD-10-CM | POA: Diagnosis not present

## 2021-09-11 DIAGNOSIS — Z01818 Encounter for other preprocedural examination: Secondary | ICD-10-CM

## 2021-09-11 DIAGNOSIS — D649 Anemia, unspecified: Secondary | ICD-10-CM | POA: Diagnosis not present

## 2021-09-11 DIAGNOSIS — N8003 Adenomyosis of the uterus: Secondary | ICD-10-CM | POA: Insufficient documentation

## 2021-09-11 HISTORY — PX: ROBOTIC ASSISTED TOTAL HYSTERECTOMY WITH BILATERAL SALPINGO OOPHERECTOMY: SHX6086

## 2021-09-11 HISTORY — DX: Unspecified asthma, uncomplicated: J45.909

## 2021-09-11 HISTORY — DX: Gastro-esophageal reflux disease without esophagitis: K21.9

## 2021-09-11 LAB — CBC
HCT: 36 % (ref 36.0–46.0)
Hemoglobin: 11.3 g/dL — ABNORMAL LOW (ref 12.0–15.0)
MCH: 25.9 pg — ABNORMAL LOW (ref 26.0–34.0)
MCHC: 31.4 g/dL (ref 30.0–36.0)
MCV: 82.4 fL (ref 80.0–100.0)
Platelets: 319 10*3/uL (ref 150–400)
RBC: 4.37 MIL/uL (ref 3.87–5.11)
RDW: 14.5 % (ref 11.5–15.5)
WBC: 12.1 10*3/uL — ABNORMAL HIGH (ref 4.0–10.5)
nRBC: 0 % (ref 0.0–0.2)

## 2021-09-11 LAB — CREATININE, SERUM
Creatinine, Ser: 0.82 mg/dL (ref 0.44–1.00)
GFR, Estimated: >60 mL/min (ref >60)

## 2021-09-11 LAB — ABO/RH: ABO/RH(D): B POS

## 2021-09-11 SURGERY — HYSTERECTOMY, TOTAL, ROBOT-ASSISTED, LAPAROSCOPIC, WITH BILATERAL SALPINGO-OOPHORECTOMY
Anesthesia: General | Site: Abdomen | Laterality: Bilateral

## 2021-09-11 MED ORDER — AMISULPRIDE (ANTIEMETIC) 5 MG/2ML IV SOLN: 10.0000 mg | Freq: Once | INTRAVENOUS | Status: DC | PRN

## 2021-09-11 MED ORDER — ACETAMINOPHEN 325 MG PO TABS: 650.0000 mg | ORAL_TABLET | ORAL | Status: DC | PRN

## 2021-09-11 MED ORDER — SUGAMMADEX SODIUM 200 MG/2ML IV SOLN
INTRAVENOUS | Status: DC | PRN
  Administered 2021-09-11: 300 mg via INTRAVENOUS

## 2021-09-11 MED ORDER — POVIDONE-IODINE 10 % EX SWAB: 2.0000 "application " | Freq: Once | CUTANEOUS | Status: DC

## 2021-09-11 MED ORDER — PHENYLEPHRINE 80 MCG/ML (10ML) SYRINGE FOR IV PUSH (FOR BLOOD PRESSURE SUPPORT)
PREFILLED_SYRINGE | INTRAVENOUS | Status: AC
  Filled 2021-09-11: qty 20

## 2021-09-11 MED ORDER — LACTATED RINGERS IV SOLN: INTRAVENOUS | Status: DC

## 2021-09-11 MED ORDER — OXYCODONE-ACETAMINOPHEN 5-325 MG PO TABS
ORAL_TABLET | ORAL | Status: AC
  Filled 2021-09-11: qty 2

## 2021-09-11 MED ORDER — FENTANYL CITRATE (PF) 250 MCG/5ML IJ SOLN
INTRAMUSCULAR | Status: AC
  Filled 2021-09-11: qty 5

## 2021-09-11 MED ORDER — ROCURONIUM BROMIDE 10 MG/ML (PF) SYRINGE
PREFILLED_SYRINGE | INTRAVENOUS | Status: DC | PRN
  Administered 2021-09-11: 100 mg via INTRAVENOUS

## 2021-09-11 MED ORDER — PROPOFOL 10 MG/ML IV BOLUS
INTRAVENOUS | Status: DC | PRN
  Administered 2021-09-11: 180 mg via INTRAVENOUS

## 2021-09-11 MED ORDER — DEXMEDETOMIDINE (PRECEDEX) IN NS 20 MCG/5ML (4 MCG/ML) IV SYRINGE
PREFILLED_SYRINGE | INTRAVENOUS | Status: AC
  Filled 2021-09-11: qty 5

## 2021-09-11 MED ORDER — HYDROCODONE-ACETAMINOPHEN 5-325 MG PO TABS: 1.0000 | ORAL_TABLET | ORAL | Status: DC | PRN

## 2021-09-11 MED ORDER — CEFAZOLIN SODIUM-DEXTROSE 2-4 GM/100ML-% IV SOLN
INTRAVENOUS | Status: AC
  Filled 2021-09-11: qty 100

## 2021-09-11 MED ORDER — BUPIVACAINE HCL (PF) 0.25 % IJ SOLN
INTRAMUSCULAR | Status: DC | PRN
  Administered 2021-09-11: 10 mL

## 2021-09-11 MED ORDER — PROMETHAZINE HCL 25 MG/ML IJ SOLN: 6.2500 mg | INTRAMUSCULAR | Status: DC | PRN

## 2021-09-11 MED ORDER — CELECOXIB 200 MG PO CAPS
ORAL_CAPSULE | ORAL | Status: AC
  Filled 2021-09-11: qty 1

## 2021-09-11 MED ORDER — LACTATED RINGERS IV SOLN
INTRAVENOUS | Status: DC
Start: 2021-09-11 — End: 2021-09-11

## 2021-09-11 MED ORDER — ACETAMINOPHEN 500 MG PO TABS
1000.0000 mg | ORAL_TABLET | Freq: Once | ORAL | Status: AC
  Administered 2021-09-11: 1000 mg via ORAL

## 2021-09-11 MED ORDER — ENOXAPARIN SODIUM 40 MG/0.4ML IJ SOSY
40.0000 mg | PREFILLED_SYRINGE | INTRAMUSCULAR | Status: DC
  Administered 2021-09-11: 40 mg via SUBCUTANEOUS

## 2021-09-11 MED ORDER — MIDAZOLAM HCL 2 MG/2ML IJ SOLN
INTRAMUSCULAR | Status: AC
  Filled 2021-09-11: qty 2

## 2021-09-11 MED ORDER — LIDOCAINE 2% (20 MG/ML) 5 ML SYRINGE
INTRAMUSCULAR | Status: DC | PRN
  Administered 2021-09-11: 100 mg via INTRAVENOUS

## 2021-09-11 MED ORDER — OXYCODONE-ACETAMINOPHEN 5-325 MG PO TABS
2.0000 | ORAL_TABLET | ORAL | Status: DC | PRN
  Administered 2021-09-11 – 2021-09-12 (×4): 2 via ORAL

## 2021-09-11 MED ORDER — HEMOSTATIC AGENTS (NO CHARGE) OPTIME
TOPICAL | Status: DC | PRN
  Administered 2021-09-11: 1

## 2021-09-11 MED ORDER — ONDANSETRON HCL 4 MG/2ML IJ SOLN
INTRAMUSCULAR | Status: AC
  Filled 2021-09-11: qty 4

## 2021-09-11 MED ORDER — ACETAMINOPHEN 500 MG PO TABS
ORAL_TABLET | ORAL | Status: AC
  Filled 2021-09-11: qty 2

## 2021-09-11 MED ORDER — DEXAMETHASONE SODIUM PHOSPHATE 10 MG/ML IJ SOLN
INTRAMUSCULAR | Status: AC
  Filled 2021-09-11: qty 2

## 2021-09-11 MED ORDER — CELECOXIB 200 MG PO CAPS
200.0000 mg | ORAL_CAPSULE | Freq: Once | ORAL | Status: AC
  Administered 2021-09-11: 200 mg via ORAL

## 2021-09-11 MED ORDER — PROPOFOL 10 MG/ML IV BOLUS
INTRAVENOUS | Status: AC
  Filled 2021-09-11: qty 20

## 2021-09-11 MED ORDER — FENTANYL CITRATE (PF) 100 MCG/2ML IJ SOLN: 25.0000 ug | INTRAMUSCULAR | Status: DC | PRN

## 2021-09-11 MED ORDER — ROCURONIUM BROMIDE 10 MG/ML (PF) SYRINGE
PREFILLED_SYRINGE | INTRAVENOUS | Status: AC
  Filled 2021-09-11: qty 30

## 2021-09-11 MED ORDER — FENTANYL CITRATE (PF) 250 MCG/5ML IJ SOLN
INTRAMUSCULAR | Status: DC | PRN
  Administered 2021-09-11 (×3): 25 ug via INTRAVENOUS
  Administered 2021-09-11: 50 ug via INTRAVENOUS
  Administered 2021-09-11 (×2): 25 ug via INTRAVENOUS
  Administered 2021-09-11: 50 ug via INTRAVENOUS
  Administered 2021-09-11: 25 ug via INTRAVENOUS

## 2021-09-11 MED ORDER — ONDANSETRON HCL 4 MG/2ML IJ SOLN
INTRAMUSCULAR | Status: DC | PRN
  Administered 2021-09-11: 4 mg via INTRAVENOUS

## 2021-09-11 MED ORDER — ATROPINE SULFATE 1 MG/10ML IJ SOSY
PREFILLED_SYRINGE | INTRAMUSCULAR | Status: AC
  Filled 2021-09-11: qty 10

## 2021-09-11 MED ORDER — SUCCINYLCHOLINE CHLORIDE 200 MG/10ML IV SOSY
PREFILLED_SYRINGE | INTRAVENOUS | Status: AC
  Filled 2021-09-11: qty 10

## 2021-09-11 MED ORDER — CEFAZOLIN SODIUM-DEXTROSE 2-4 GM/100ML-% IV SOLN
2.0000 g | INTRAVENOUS | Status: AC
  Administered 2021-09-11: 2 g via INTRAVENOUS

## 2021-09-11 MED ORDER — DEXAMETHASONE SODIUM PHOSPHATE 10 MG/ML IJ SOLN
INTRAMUSCULAR | Status: DC | PRN
  Administered 2021-09-11: 10 mg via INTRAVENOUS

## 2021-09-11 MED ORDER — ENOXAPARIN SODIUM 40 MG/0.4ML IJ SOSY
PREFILLED_SYRINGE | INTRAMUSCULAR | Status: AC
  Filled 2021-09-11: qty 0.4

## 2021-09-11 MED ORDER — MIDAZOLAM HCL 2 MG/2ML IJ SOLN
INTRAMUSCULAR | Status: DC | PRN
  Administered 2021-09-11: 2 mg via INTRAVENOUS

## 2021-09-11 MED ORDER — SODIUM CHLORIDE 0.9 % IR SOLN
Status: DC | PRN
  Administered 2021-09-11: 300 mL
  Administered 2021-09-11: 1000 mL

## 2021-09-11 MED ORDER — STERILE WATER FOR IRRIGATION IR SOLN
Status: DC | PRN
  Administered 2021-09-11: 500 mL

## 2021-09-11 SURGICAL SUPPLY — 62 items
ADH SKN CLS APL DERMABOND .7 (GAUZE/BANDAGES/DRESSINGS) ×1
APL ESCP 73.6OZ SRGCL (TIP) ×1
CATH FOLEY 3WAY  5CC 16FR (CATHETERS) ×2
CATH FOLEY 3WAY 5CC 16FR (CATHETERS) ×1 IMPLANT
COVER BACK TABLE 60X90IN (DRAPES) ×2 IMPLANT
COVER TIP SHEARS 8 DVNC (MISCELLANEOUS) ×1 IMPLANT
COVER TIP SHEARS 8MM DA VINCI (MISCELLANEOUS) ×2
DECANTER SPIKE VIAL GLASS SM (MISCELLANEOUS) ×4 IMPLANT
DEFOGGER SCOPE WARMER CLEARIFY (MISCELLANEOUS) ×2 IMPLANT
DERMABOND ADVANCED (GAUZE/BANDAGES/DRESSINGS) ×1
DERMABOND ADVANCED .7 DNX12 (GAUZE/BANDAGES/DRESSINGS) ×1 IMPLANT
DRAPE ARM DVNC X/XI (DISPOSABLE) ×4 IMPLANT
DRAPE COLUMN DVNC XI (DISPOSABLE) ×1 IMPLANT
DRAPE DA VINCI XI ARM (DISPOSABLE) ×8
DRAPE DA VINCI XI COLUMN (DISPOSABLE) ×2
DRAPE UTILITY XL STRL (DRAPES) ×2 IMPLANT
DRSG ADAPTIC 3X8 NADH LF (GAUZE/BANDAGES/DRESSINGS) ×1 IMPLANT
DURAPREP 26ML APPLICATOR (WOUND CARE) ×2 IMPLANT
ELECT REM PT RETURN 9FT ADLT (ELECTROSURGICAL) ×2
ELECTRODE REM PT RTRN 9FT ADLT (ELECTROSURGICAL) ×1 IMPLANT
GAUZE 4X4 16PLY ~~LOC~~+RFID DBL (SPONGE) ×4 IMPLANT
GAUZE VASELINE 3X9 (GAUZE/BANDAGES/DRESSINGS) ×2 IMPLANT
GLOVE BIO SURGEON STRL SZ 6 (GLOVE) ×2 IMPLANT
GLOVE BIO SURGEON STRL SZ 6.5 (GLOVE) ×6 IMPLANT
GLOVE BIOGEL PI IND STRL 6 (GLOVE) IMPLANT
GLOVE BIOGEL PI IND STRL 7.0 (GLOVE) ×5 IMPLANT
GLOVE BIOGEL PI INDICATOR 6 (GLOVE) ×2
GLOVE BIOGEL PI INDICATOR 7.0 (GLOVE) ×10
HOLDER FOLEY CATH W/STRAP (MISCELLANEOUS) ×1 IMPLANT
IRRIG SUCT STRYKERFLOW 2 WTIP (MISCELLANEOUS) ×2
IRRIGATION SUCT STRKRFLW 2 WTP (MISCELLANEOUS) ×1 IMPLANT
IV NS 1000ML (IV SOLUTION) ×4
IV NS 1000ML BAXH (IV SOLUTION) IMPLANT
KIT TURNOVER CYSTO (KITS) ×2 IMPLANT
LEGGING LITHOTOMY PAIR STRL (DRAPES) ×2 IMPLANT
OBTURATOR OPTICAL STANDARD 8MM (TROCAR) ×2
OBTURATOR OPTICAL STND 8 DVNC (TROCAR) ×1
OBTURATOR OPTICALSTD 8 DVNC (TROCAR) ×1 IMPLANT
OCCLUDER COLPOPNEUMO (BALLOONS) ×2 IMPLANT
PACK ROBOT WH (CUSTOM PROCEDURE TRAY) ×2 IMPLANT
PACK ROBOTIC GOWN (GOWN DISPOSABLE) ×2 IMPLANT
PACK TRENDGUARD 450 HYBRID PRO (MISCELLANEOUS) IMPLANT
PAD OB MATERNITY 4.3X12.25 (PERSONAL CARE ITEMS) ×2 IMPLANT
PAD PREP 24X48 CUFFED NSTRL (MISCELLANEOUS) ×2 IMPLANT
POWDER SURGICEL 3.0 GRAM (HEMOSTASIS) ×1 IMPLANT
PROTECTOR NERVE ULNAR (MISCELLANEOUS) ×4 IMPLANT
SEAL CANN UNIV 5-8 DVNC XI (MISCELLANEOUS) ×4 IMPLANT
SEAL XI 5MM-8MM UNIVERSAL (MISCELLANEOUS) ×8
SET IRRIG Y TYPE TUR BLADDER L (SET/KITS/TRAYS/PACK) ×1 IMPLANT
SET TRI-LUMEN FLTR TB AIRSEAL (TUBING) ×2 IMPLANT
SPIKE FLUID TRANSFER (MISCELLANEOUS) ×1 IMPLANT
SPONGE T-LAP 4X18 ~~LOC~~+RFID (SPONGE) ×1 IMPLANT
SUT DVC VLOC 180 0 12IN GS21 (SUTURE) ×2
SUT VIC AB 4-0 PS2 27 (SUTURE) ×6 IMPLANT
SUT VICRYL 0 UR6 27IN ABS (SUTURE) ×2 IMPLANT
SUTURE DVC VLC 180 0 12IN GS21 (SUTURE) IMPLANT
TIP ENDOSCOPIC SURGICEL (TIP) ×1 IMPLANT
TIP UTERINE 6.7X10CM GRN DISP (MISCELLANEOUS) ×1 IMPLANT
TOWEL OR 17X26 10 PK STRL BLUE (TOWEL DISPOSABLE) ×2 IMPLANT
TRENDGUARD 450 HYBRID PRO PACK (MISCELLANEOUS) ×2
TROCAR PORT AIRSEAL 8X120 (TROCAR) ×2 IMPLANT
WATER STERILE IRR 500ML POUR (IV SOLUTION) ×1 IMPLANT

## 2021-09-11 NOTE — Anesthesia Procedure Notes (Signed)
Procedure Name: Intubation Date/Time: 09/11/2021 12:38 PM Performed by: Clearnce Sorrel, CRNA Pre-anesthesia Checklist: Patient identified, Emergency Drugs available, Suction available and Patient being monitored Patient Re-evaluated:Patient Re-evaluated prior to induction Oxygen Delivery Method: Circle System Utilized Preoxygenation: Pre-oxygenation with 100% oxygen Induction Type: IV induction Ventilation: Mask ventilation without difficulty Laryngoscope Size: Mac and 3 Grade View: Grade I Tube type: Oral Tube size: 7.0 mm Number of attempts: 1 Airway Equipment and Method: Stylet and Oral airway Placement Confirmation: ETT inserted through vocal cords under direct vision, positive ETCO2 and breath sounds checked- equal and bilateral Secured at: 22 cm Tube secured with: Tape Dental Injury: Teeth and Oropharynx as per pre-operative assessment

## 2021-09-11 NOTE — H&P (Signed)
Samantha [name] is an 66 y.o. female. [id number]    RP: XI Robotic TLH/BSO on 09/11/2021   HPI: No change x last visit.  No current PMB.  S/P HSC/Myosure Excision/D+C on 02/20/2021.  Patho 02/20/2021:  Benign endometrial polyp and Focal non-atypical hyperplasia. No malignancy identified.  Stopped the Progestin only pill. Frequent vaginal bleeding, at times heavy.  Megace on-off.  H/O TIAs/DVT on Xeralto.  Last dose of Xeralto on 09/05/21 for surgery.    Pertinent Gynecological History: Menses: post-menopausal Contraception: post menopausal status Blood transfusions: none Last mammogram: normal  Last pap: normal     Menstrual History:  No LMP recorded. Patient is postmenopausal.    Past Medical History:  Diagnosis Date   Asthma    Patient has Albuterol and Breo inhaler. Follows with [facility], [place] 12/26/20.   Borderline glaucoma of both eyes    Bronchitis 75/64/3329   Complication of anesthesia    per pt with cholecystectomy due to tube unable to speak for days   COPD with emphysema [facility])    pulmonology--- dr Clearence Ped at [facility], [place] 12/26/20 ,  hoarse,  (02-19-2021  per pt had pcp visit 02-04-2021 told having exacerbation   DOE (dyspnea on exertion)    per pt w/ stairs, but avoids, unable long distance walking due knee pain, ok with housework   DVT of lower extremity (deep venous thrombosis) ([place]) 05/2021   left, currently on Xarelto as of 09/03/21   Dysphasia    per pt pcp gave her protonix   Endometrial polyp    GERD (gastroesophageal reflux disease)    History of COVID-19 12/02/2020   positive result in care everywhere;   per pt moderate symptoms , like bronchitis,  pt states only occasional resdiual   History of TIA (transient ischemic attack)    per pt in mid to late age 50s , told possible tia but imgaing was negative , symptom was vertigo which resolved , none issue since   Hypertension    OA (osteoarthritis)    knees   PMB (postmenopausal  bleeding) 2021   Pre-diabetes    Rotator cuff tear, right    w/ shoulder pain   Wears glasses     Past Surgical History:  Procedure Laterality Date   [place] N/A 02/20/2021   Procedure: DILATATION & CURETTAGE/HYSTEROSCOPY WITH MYOSURE;  Surgeon: [name], MD;  Location: [facility];  Service: Gynecology;  Laterality: N/A;   DILATION AND CURETTAGE OF UTERUS  1980   TUBAL LIGATION Bilateral 1992    Family History  Problem Relation Age of Onset   Hypertension Mother    Anuerysm Mother     Social History:  reports that she quit smoking about 16 months ago. Her smoking use included cigarettes. She has never used smokeless tobacco. She reports that she does not currently use alcohol. She reports that she does not use drugs.  Allergies:  Allergies  Allergen Reactions   Fexofenadine Itching   Flonase [Fluticasone] Itching   Singulair [Montelukast] Itching    Medications Prior to Admission  Medication Sig Dispense Refill Last Dose   albuterol (VENTOLIN HFA) 108 (90 Base) MCG/ACT inhaler Inhale 1-2 puffs into the lungs every 4 (four) hours as needed.   09/11/2021 at 0900   BREO ELLIPTA 200-25 MCG/INH AEPB Inhale 1 puff into the lungs at bedtime.   09/10/2021   cetirizine (ZYRTEC) 10 MG tablet Take 10 mg by mouth  daily.   09/11/2021 at 0900   Cholecalciferol (D3) 50 MCG (2000 UT) TABS    09/10/2021   Iron, Ferrous Sulfate, 325 (65 Fe) MG TABS    09/10/2021   lisinopril-hydrochlorothiazide (PRINZIDE,ZESTORETIC) 20-12.5 MG tablet Take 1 tablet by mouth daily.  12 09/11/2021 at 0900   pantoprazole (PROTONIX) 40 MG tablet 1 tablet   09/11/2021 at 0900   XARELTO 20 MG TABS tablet Take 20 mg by mouth daily.   09/05/2021    REVIEW OF SYSTEMS: A ROS was performed and pertinent positives and negatives are included in the history. GENERAL: No fevers or chills. HEENT: No change in vision, no earache, sore throat or sinus  congestion. NECK: No pain or stiffness. CARDIOVASCULAR: No chest pain or pressure. No palpitations. PULMONARY: No shortness of breath, cough or wheeze. GASTROINTESTINAL: No abdominal pain, nausea, vomiting or diarrhea, melena or bright red blood per rectum. GENITOURINARY: No urinary frequency, urgency, hesitancy or dysuria. MUSCULOSKELETAL: No joint or muscle pain, no back pain, no recent trauma. DERMATOLOGIC: No rash, no itching, no lesions. ENDOCRINE: No polyuria, polydipsia, no heat or cold intolerance. No recent change in weight. HEMATOLOGICAL: No anemia or easy bruising or bleeding. NEUROLOGIC: No headache, seizures, numbness, tingling or weakness. PSYCHIATRIC: No depression, no loss of interest in normal activity or change in sleep pattern.     Blood pressure (!) 141/76, pulse 92, temperature (!) 97.2 F (36.2 C), temperature source Oral, resp. rate 18, height '5\' 3"'$  (1.6 m), weight 112.2 kg, SpO2 97 %.  Physical Exam:  Pelvic US 06/20/2021: T/V and T/a images.  Anteverted enlarged uterus with 2 definitive fibroids visualized.  The overall uterine size is measured at 10.1 x 7.78 x 6.79 cm.  1 anterior subserosal fibroid is measured at 2.2 x 1.5 cm and another fibroid in the lower uterine segment is measured at 1.4 x 1.6 cm.  The endometrial lining is secretory in appearance and measures approximately 9.6 mm.  Trace amount of fluid with debris are seen in the cavity.  No obvious mass or abnormal vascularity is seen.  Right ovary is atrophic in appearance within normal limits.  The left ovary is not well visualized transvaginally or transabdominally.  No adnexal mass seen.  No free fluid in the pelvis.     Assessment/Plan:  66 y.o. [id number]    1. Recurrent Postmenopausal bleeding S/P HSC/Myosure Excision/D+C on 02/20/2021.  Patho 02/20/2021:  Benign endometrial polyp and Focal non-atypical hyperplasia. No malignancy identified.  Stopped the Progestin only pill. Frequent vaginal bleeding, at times  heavy.  Megace on-off.  H/O TIAs/DVT on Xeralto. Last dose of Xeralto on 09/05/21.  Decision to proceed with an XI Robotic TLH/BSO.  Will give Prophylaxis anticoagulant.  Preop preparation, surgery and risks, as well as postop precautions and expectations reviewed thoroughly. Patient voiced understanding and agreement with the plan.   2. Fibroids   3. Thickened endometrium Endometrial line 9.6 mm on Pelvic US in 06/2021.   4. Simple endometrial hyperplasia without atypia   Other orders - XARELTO 20 MG TABS tablet; Take 20 mg by mouth daily. - Cholecalciferol (D3) 50 MCG (2000 UT) TABS - Iron, Ferrous Sulfate, 325 (65 Fe) MG TABS                          Patient was counseled as to the risk of surgery to include the following:   1. Infection (prohylactic antibiotics will be administered)   2. DVT/Pulmonary Embolism (prophylactic pneumo  compression stockings will be used)   3.Trauma to internal organs requiring additional surgical procedure to repair any injury to internal organs requiring perhaps additional hospitalization days.   4.Hemmorhage requiring transfusion and blood products which carry risks such as anaphylactic reaction, hepatitis and AIDS   Patient had received literature information on the procedure scheduled and all her questions were answered and fully accepts all risk.    Marie-Lyne Yasmeen Manka 09/11/2021, 12:08 PM

## 2021-09-11 NOTE — Anesthesia Postprocedure Evaluation (Signed)
Anesthesia Post Note  Patient: Samantha [name]  Procedure(s) Performed: XI ROBOTIC ASSISTED TOTAL HYSTERECTOMY WITH BILATERAL SALPINGO OOPHORECTOMY (Bilateral: Abdomen)     Patient location during evaluation: PACU Anesthesia Type: General Level of consciousness: awake and alert Pain management: pain level controlled Vital Signs Assessment: post-procedure vital signs reviewed and stable Respiratory status: spontaneous breathing, nonlabored ventilation, respiratory function stable and patient connected to nasal cannula oxygen Cardiovascular status: blood pressure returned to baseline and stable Postop Assessment: no apparent nausea or vomiting Anesthetic complications: no   No notable events documented.  Last Vitals:  Vitals:   09/11/21 1545 09/11/21 1600  BP: (!) 130/114 (!) 132/92  Pulse: 95 96  Resp: 16 (!) 23  Temp:  36.6 C  SpO2: 93% 97%    Last Pain:  Vitals:   09/11/21 1600  TempSrc:   PainSc: 0-No pain                 [name]

## 2021-09-11 NOTE — Progress Notes (Signed)
09/11/2021 5:02 PM Phlebotomy unable to obtain sample for CBC due to poor venous access. Order placed for IV team consult per protocol to assist with lab draw.  Teira Arcilla, Arville Lime

## 2021-09-11 NOTE — Op Note (Signed)
Operative Note  09/11/2021  4:15 PM  PATIENT:  Samantha [name]  66 y.o. female  PRE-OPERATIVE DIAGNOSIS:  Postmenopausal bleeding, thickened endometrium, H/O hyperplasia without atypia  POST-OPERATIVE DIAGNOSIS:  Postmenopausal bleeding, thickened endometrium, H/O hyperplasia without atypia.    PROCEDURE:  Procedure(s): XI ROBOTIC ASSISTED TOTAL HYSTERECTOMY WITH BILATERAL SALPINGO OOPHORECTOMY  SURGEON:  Surgeon(s): [name], MD Assistance:  Olivia Mackie RNFA  ANESTHESIA:   general  FINDINGS: Uterus with Fibroids, tubes status post bilateral tubal ligation, normal bilateral ovaries  DESCRIPTION OF OPERATION: Under general anesthesia with endotracheal intubation, the patient is in lithotomy position.  She is prepped with DuraPrep on the abdomen and Betadine on the suprapubic, vulvar and vaginal areas.  Timeout is done.  The patient is draped as usual.  The vaginal exam reveals an anteverted uterus about 10 cm mobile, mildly nodular.  No adnexal mass.  The Foley is inserted in the bladder.  The weighted speculum is inserted in the vagina and the anterior lip of the cervix is grasped with a tenaculum.  Mild dilation of the cervix with Kennon Rounds dilators.  Hysterometry is at 12 cm.  We used a #10 Rumi with the medium Koh ring which are put in place easily.  The other instruments are removed.  We go to the abdomen.      The supraumbilical area is infiltrated with Marcaine one quarter plain.  A 1.5 cm incision is done with the scalpel at that level.  The aponeurosis is grasped with cokers and opened under direct vision with Mayo scissors.  The parietal peritoneum is opened bluntly with the finger.  A pursestring stitch of Vicryl 0 is done on the aponeurosis.  The Sheryle Hail is inserted under direct vision and pneumoperitoneum is created with CO2.  The camera is inserted at that level.  The anterior wall of the abdomen is free of adhesion where the ports will be inserted.  The uterus is mildly nodular  about 12 cm and both ovaries are normal in size and aspect.  The tubes are status post tubal ligation bilaterally.  The camera is removed.  The skin is marked with a surgical pen where the ports will be placed.  Infiltration of Marcaine one quarter plain and incision at those locations.  All ports are inserted under direct vision with 2 robotic ports in line with the umbilicus on the right side and 1 robotic port in line with the umbilicus at the distal left side.  The assistant port is inserted at the right medial aspect in line with the umbilicus.  The patient is positioned in 30 degree Trendelenburg.  She tolerates the position well.  The robot is docked.  Targeting is done.  Robotic instruments are inserted under direct vision with the fenestrated clamp in the fourth arm, the scissors in the third arm, the camera in the second arm and the bipolar in the first arm.  We go to the console.      Pictures are taken of the pelvic anatomy as previously described.  Both ureters are in normal anatomy position with good peristalsis.  The bladder is redundant but normal otherwise.  We start on the right side with cauterization and section of the right infundibulopelvic ligament.  We cauterized and section below the right ovary.  We then cauterized and section the right round ligament.  We then opened the visceral peritoneum anteriorly to the midline and start lowering the bladder.  We proceeded exactly the same way on the left side and further  lowered the bladder past the [facility] ring.  The bladder being redundant we decided to fill it with saline to better see its border and safely declining it.  We then cauterized the right uterine artery and its backflow.  We cauterized the left uterine artery and its backflow and then sectioned it.  We finally sectioned the right uterine artery.  The vaginal occluder was inflated.  We opened the vagina on top of the Koh ring with the tip of the scissors anteriorly, bilaterally and  posteriorly.  The uterus was completely detached with both ovaries, both tubes and the cervix.  The specimen was passed vaginally and sent to pathology.  The occluder was put back in place in the vagina.  Hemostasis was completed with the bipolar at the vaginal vault.  Irrigation and suction was done.  We then switched the instruments to the cutting needle driver in the third arm and the long tip in the first arm.  We used a V-Loc 0 at 12 inches to close the vaginal vault starting at the right angle all the way to the left angle and back to the middle in a running suture.  Hemostasis was adequate at all levels.  The robotic instruments were removed under direct vision and the robot was undocked.  We went to laparoscopy time.      The patient was removed from 30 degree Trendelenburg to suction and irrigate the abdominopelvic cavities.  She was then put back in Trendelenburg to apply Surgicel powder for hemostasis.  This was done as an extra measure of security given that the patient will start back on Xarelto on postop day #1.  She is currently on Lovenox prophylaxis.  After 1 to 2 minutes of Surgicel powder application, the powder was irrigated and suctioned.  Pictures were taken.  All laparoscopy instruments were removed under direct vision.  The ports were removed under direct vision.  The CO2 was evacuated.  The pursestring stitch at the supraumbilical incision was attached.  All incisions were closed with a subcuticular suture of Vicryl 4-0.  Dermabond was applied on all incisions.  Hemostasis was adequate at all levels.  The occluder was removed from the vagina.  Hemostasis was adequate at that level as well.  The patient was brought to recovery room in good and stable status.   ESTIMATED BLOOD LOSS: 50 mL   Intake/Output Summary (Last 24 hours) at 09/11/2021 1615 Last data filed at 09/11/2021 1500 Gross per 24 hour  Intake --  Output 250 ml  Net -250 ml     BLOOD ADMINISTERED:none   LOCAL  MEDICATIONS USED:  MARCAINE     SPECIMEN:  Source of Specimen:  Uterus with cervix, bilateral tubes and ovaries  DISPOSITION OF SPECIMEN:  PATHOLOGY  COUNTS:  YES  PLAN OF CARE: Transfer to PACU  Marie-Lyne LavoieMD4:15 PM

## 2021-09-11 NOTE — Transfer of Care (Signed)
Immediate Anesthesia Transfer of Care Note  Patient: Samantha [name]  Procedure(s) Performed: XI ROBOTIC ASSISTED TOTAL HYSTERECTOMY WITH BILATERAL SALPINGO OOPHORECTOMY (Bilateral: Abdomen)  Patient Location: PACU  Anesthesia Type:General  Level of Consciousness: drowsy and patient cooperative  Airway & Oxygen Therapy: Patient Spontanous Breathing and Patient connected to face mask oxygen  Post-op Assessment: Report given to RN and Post -op Vital signs reviewed and stable  Post vital signs: Reviewed and stable  Last Vitals:  Vitals Value Taken Time  BP    Temp    Pulse 101 09/11/21 1527  Resp 12 09/11/21 1527  SpO2 100 % 09/11/21 1527  Vitals shown include unvalidated device data.  Last Pain:  Vitals:   09/11/21 1033  TempSrc: Oral      Patients Stated Pain Goal: 5 (97/95/36 9223)  Complications: No notable events documented.

## 2021-09-11 NOTE — Progress Notes (Signed)
IV team consulted for lab draw, spoke  and explained to RN that we don't start PIV just to get lab,there is no assurance that blood will be obtained. RN verbalized understanding and will call phlebotomist to collect lab.

## 2021-09-11 NOTE — Anesthesia Preprocedure Evaluation (Addendum)
Anesthesia Evaluation  Patient identified by MRN, date of birth, ID band Patient awake    Reviewed: Allergy & Precautions, NPO status , Patient's Chart, lab work & pertinent test results  Airway Mallampati: III  TM Distance: >3 FB     Dental no notable dental hx. (+) Dental Advisory Given   Pulmonary COPD,  COPD inhaler, former smoker,    Pulmonary exam normal        Cardiovascular hypertension, Pt. on medications + DVT  Normal cardiovascular exam     Neuro/Psych negative neurological ROS     GI/Hepatic negative GI ROS, Neg liver ROS,   Endo/Other  Morbid obesity (BMI 43.91)  Renal/GU      Musculoskeletal  (+) Arthritis ,   Abdominal (+) + obese (BMI 45.49),   Peds  Hematology  (+) Blood dyscrasia, anemia ,   Anesthesia Other Findings   Reproductive/Obstetrics                           Anesthesia Physical  Anesthesia Plan  ASA: 3  Anesthesia Plan: General   Post-op Pain Management: Celebrex PO (pre-op)* and Tylenol PO (pre-op)*   Induction: Intravenous  PONV Risk Score and Plan: 4 or greater and Treatment may vary due to age or medical condition, Ondansetron, Midazolam and Dexamethasone  Airway Management Planned: Oral ETT  Additional Equipment: None  Intra-op Plan:   Post-operative Plan: Extubation in OR  Informed Consent: I have reviewed the patients History and Physical, chart, labs and discussed the procedure including the risks, benefits and alternatives for the proposed anesthesia with the patient or authorized representative who has indicated his/her understanding and acceptance.     Dental advisory given  Plan Discussed with: CRNA and Anesthesiologist  Anesthesia Plan Comments:       Anesthesia Quick Evaluation

## 2021-09-12 ENCOUNTER — Encounter (HOSPITAL_BASED_OUTPATIENT_CLINIC_OR_DEPARTMENT_OTHER): Payer: Self-pay | Admitting: Obstetrics & Gynecology

## 2021-09-12 DIAGNOSIS — N950 Postmenopausal bleeding: Secondary | ICD-10-CM | POA: Diagnosis not present

## 2021-09-12 LAB — SURGICAL PATHOLOGY

## 2021-09-12 MED ORDER — OXYCODONE-ACETAMINOPHEN 7.5-325 MG PO TABS: 1.0000 | ORAL_TABLET | Freq: Four times a day (QID) | ORAL | 0 refills | Status: DC | PRN

## 2021-09-12 MED ORDER — OXYCODONE-ACETAMINOPHEN 5-325 MG PO TABS
ORAL_TABLET | ORAL | Status: AC
  Filled 2021-09-12: qty 2

## 2021-09-12 NOTE — Progress Notes (Signed)
POD#1 XI Robotic TLH/BSO  Subjective: Patient reports tolerating PO, + flatus, and no problems voiding.    Objective: I have reviewed patient's vital signs.  vital signs, medications, and labs.  Vitals:   09/12/21 0625 09/12/21 0800  BP: 106/61 124/66  Pulse: 84 (!) 104  Resp: 16 18  Temp: 98.4 F (36.9 C) 98.2 F (36.8 C)  SpO2: 97% 97%   I/O last 3 completed shifts: In: 100 [I.V.:100] Out: 625 [Urine:575; Blood:50] No intake/output data recorded.  Results for orders placed or performed during the hospital encounter of 09/11/21 (from the past 24 hour(s))  ABO/Rh     Status: None   Collection Time: 09/11/21 12:43 PM  Result Value Ref Range   ABO/RH(D)      B POS Performed at [facility], [place] [address], [place], [place] [address]   CBC     Status: Abnormal   Collection Time: 09/11/21  6:36 PM  Result Value Ref Range   WBC 12.1 (H) 4.0 - 10.5 K/uL   RBC 4.37 3.87 - 5.11 MIL/uL   Hemoglobin 11.3 (L) 12.0 - 15.0 g/dL   HCT 36.0 36.0 - 46.0 %   MCV 82.4 80.0 - 100.0 fL   MCH 25.9 (L) 26.0 - 34.0 pg   MCHC 31.4 30.0 - 36.0 g/dL   RDW 14.5 11.5 - 15.5 %   Platelets 319 150 - 400 K/uL   nRBC 0.0 0.0 - 0.2 %  Creatinine, serum     Status: None   Collection Time: 09/11/21  6:36 PM  Result Value Ref Range   Creatinine, Ser 0.82 0.44 - 1.00 mg/dL   GFR, Estimated >60 >60 mL/min    EXAM General: alert, cooperative, and appears stated age Resp: clear to auscultation bilaterally Cardio: regular rate and rhythm GI: soft, non-tender; bowel sounds normal; no masses,  no organomegaly Extremities: no edema, redness or tenderness in the calves or thighs Vaginal Bleeding: minimal  Assessment: s/p Procedure(s): XI ROBOTIC ASSISTED TOTAL HYSTERECTOMY WITH BILATERAL SALPINGO OOPHORECTOMY: stable, progressing well, and tolerating diet  Plan: Discharge home  LOS: 0 days    [name], MD 09/12/2021 8:19 AM

## 2021-09-25 ENCOUNTER — Encounter: Payer: No Typology Code available for payment source | Admitting: Obstetrics & Gynecology

## 2021-10-01 ENCOUNTER — Encounter: Payer: Self-pay | Admitting: Obstetrics & Gynecology

## 2021-10-01 ENCOUNTER — Ambulatory Visit (INDEPENDENT_AMBULATORY_CARE_PROVIDER_SITE_OTHER): Payer: No Typology Code available for payment source | Admitting: Obstetrics & Gynecology

## 2021-10-01 VITALS — BP 112/84 | HR 108

## 2021-10-01 DIAGNOSIS — Z09 Encounter for follow-up examination after completed treatment for conditions other than malignant neoplasm: Secondary | ICD-10-CM

## 2021-10-23 ENCOUNTER — Encounter: Payer: No Typology Code available for payment source | Admitting: Obstetrics & Gynecology

## 2021-10-24 DIAGNOSIS — E559 Vitamin D deficiency, unspecified: Secondary | ICD-10-CM | POA: Diagnosis not present

## 2021-10-24 DIAGNOSIS — D649 Anemia, unspecified: Secondary | ICD-10-CM | POA: Diagnosis not present

## 2021-10-24 DIAGNOSIS — Z86718 Personal history of other venous thrombosis and embolism: Secondary | ICD-10-CM | POA: Diagnosis not present

## 2021-10-24 DIAGNOSIS — J301 Allergic rhinitis due to pollen: Secondary | ICD-10-CM | POA: Diagnosis not present

## 2021-10-24 DIAGNOSIS — Z0000 Encounter for general adult medical examination without abnormal findings: Secondary | ICD-10-CM | POA: Diagnosis not present

## 2021-10-24 DIAGNOSIS — Z23 Encounter for immunization: Secondary | ICD-10-CM | POA: Diagnosis not present

## 2021-10-24 DIAGNOSIS — K219 Gastro-esophageal reflux disease without esophagitis: Secondary | ICD-10-CM | POA: Diagnosis not present

## 2021-10-24 DIAGNOSIS — Z79899 Other long term (current) drug therapy: Secondary | ICD-10-CM | POA: Diagnosis not present

## 2021-10-24 DIAGNOSIS — I10 Essential (primary) hypertension: Secondary | ICD-10-CM | POA: Diagnosis not present

## 2021-10-24 DIAGNOSIS — J4520 Mild intermittent asthma, uncomplicated: Secondary | ICD-10-CM | POA: Diagnosis not present

## 2021-10-24 DIAGNOSIS — M170 Bilateral primary osteoarthritis of knee: Secondary | ICD-10-CM | POA: Diagnosis not present

## 2021-10-30 ENCOUNTER — Encounter: Payer: No Typology Code available for payment source | Admitting: Obstetrics & Gynecology

## 2021-11-08 ENCOUNTER — Encounter: Payer: Self-pay | Admitting: Obstetrics & Gynecology

## 2021-11-08 ENCOUNTER — Ambulatory Visit (INDEPENDENT_AMBULATORY_CARE_PROVIDER_SITE_OTHER): Payer: No Typology Code available for payment source | Admitting: Obstetrics & Gynecology

## 2021-11-08 VITALS — BP 120/84 | HR 99

## 2021-11-08 DIAGNOSIS — Z09 Encounter for follow-up examination after completed treatment for conditions other than malignant neoplasm: Secondary | ICD-10-CM

## 2021-11-08 NOTE — Progress Notes (Signed)
    Samantha [name] 15-Jun-1955 [id number]        66 y.o.  [id number]   RP: Postop XI TLH/BSO 09/11/21  HPI: Very good postop evolution.  Incisions healing well. No abdominopelvic pain.  No vaginal bleeding or discharge.  Urine/BMs normal. No fever.   OB History  Gravida Para Term Preterm AB Living  '6 3 3   3 3  '$ SAB IAB Ectopic Multiple Live Births  1 2 0        # Outcome Date GA Lbr Len/2nd Weight Sex Delivery Anes PTL Lv  6 IAB           5 Term           4 Term           3 Term           2 IAB           1 SAB             Past medical history,surgical history, problem list, medications, allergies, family history and social history were all reviewed and documented in the EPIC chart.   Directed ROS with pertinent positives and negatives documented in the history of present illness/assessment and plan.  Exam:  Vitals:   11/08/21 1631  BP: 120/84  Pulse: 99  SpO2: 99%   General appearance:  Normal  Abdomen: Incisions well healed  Gynecologic exam: Vulva normal.  Bimanual exam:  Vaginal vault well healed.  No pelvic mass or tenderness.  No blood or discharge.   Assessment/Plan:  66 y.o. [id number]   1. Status post gynecological surgery, follow-up exam  Excellent postop healing.  No complication.  Benign patho.  Will f/u with me every 2 years.  Annual exam with Fam MD.  Schedule Mammo at [facility] now.  [name] MD, 4:53 PM 11/08/2021

## 2021-11-12 ENCOUNTER — Other Ambulatory Visit: Payer: Self-pay | Admitting: Family Medicine

## 2021-11-12 DIAGNOSIS — Z86718 Personal history of other venous thrombosis and embolism: Secondary | ICD-10-CM

## 2021-11-13 ENCOUNTER — Ambulatory Visit
Admission: RE | Admit: 2021-11-13 | Discharge: 2021-11-13 | Disposition: A | Discharge status: Home / Self Care | Payer: No Typology Code available for payment source | Source: Ambulatory Visit | Attending: Family Medicine | Admitting: Family Medicine

## 2021-11-13 DIAGNOSIS — Z86718 Personal history of other venous thrombosis and embolism: Secondary | ICD-10-CM

## 2021-11-13 DIAGNOSIS — I82402 Acute embolism and thrombosis of unspecified deep veins of left lower extremity: Secondary | ICD-10-CM | POA: Diagnosis not present

## 2022-03-19 DIAGNOSIS — Z1211 Encounter for screening for malignant neoplasm of colon: Secondary | ICD-10-CM | POA: Diagnosis not present

## 2022-04-08 DIAGNOSIS — M79662 Pain in left lower leg: Secondary | ICD-10-CM | POA: Diagnosis not present

## 2022-04-15 ENCOUNTER — Ambulatory Visit: Payer: No Typology Code available for payment source | Admitting: Primary Care

## 2022-04-28 ENCOUNTER — Ambulatory Visit: Payer: No Typology Code available for payment source | Admitting: Primary Care

## 2022-05-06 ENCOUNTER — Ambulatory Visit (INDEPENDENT_AMBULATORY_CARE_PROVIDER_SITE_OTHER): Payer: No Typology Code available for payment source | Admitting: Primary Care

## 2022-05-06 ENCOUNTER — Encounter: Payer: Self-pay | Admitting: Primary Care

## 2022-05-06 VITALS — BP 120/70 | HR 80 | Temp 97.8°F | Ht 64.0 in | Wt 238.2 lb

## 2022-05-06 DIAGNOSIS — J45909 Unspecified asthma, uncomplicated: Secondary | ICD-10-CM | POA: Insufficient documentation

## 2022-05-06 DIAGNOSIS — J4531 Mild persistent asthma with (acute) exacerbation: Secondary | ICD-10-CM | POA: Diagnosis not present

## 2022-05-06 MED ORDER — PREDNISONE 10 MG PO TABS: ORAL_TABLET | ORAL | 0 refills | Status: AC

## 2022-05-06 MED ORDER — MONTELUKAST SODIUM 10 MG PO TABS: 10.0000 mg | ORAL_TABLET | Freq: Every day | ORAL | 1 refills | Status: DC

## 2022-05-06 NOTE — Progress Notes (Signed)
$'@Patient'N$  ID: Samantha [name], female    DOB: 1955/07/20, 67 y.o.   MRN: [id number]  Chief Complaint  Patient presents with   Follow-up    Referring provider: Jonathon Jordan, MD  HPI: Female, former smoker. Past medical history significant for hypertension, asthma, allergic rhinitis, dysphagia.  Patient of Dr. Ander Slade, last seen on 12/26/20.  05/06/2022- Interim hx  Patient presents today for follow-up. Hx asthma. PFTs showed mild obstructive disease. She had stopped BREO but noticed recurrence of symptoms. During her last visit it was recommended she see gastroenterology d/t dysphagia symptoms. She is currently on Wixella 277mg. She feels asthma symptoms have been worse last week. She has chest tightness and dry cough. Experiences shortness of breath with household chores.    Allergies  Allergen Reactions   Fexofenadine Itching   Flonase [Fluticasone] Itching   Singulair [Montelukast] Itching    Immunization History  Administered Date(s) Administered   PFIZER(Purple Top)SARS-COV-2 Vaccination 04/11/2020, 05/02/2020    Past Medical History:  Diagnosis Date   Asthma    Patient has Albuterol and Breo inhaler. [facility] [place]   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 ([facility] [place] ,  hoarse,  (02-19-2021  per pt had pcp visit 02-04-2021 told having exacerbation   DOE (dyspnea on exertion)    per pt w/ stairs, but avoids, unable long distance walking due knee pain, ok with housework   DVT of lower extremity (deep venous thrombosis) ([facility] 05/2021   left, currently on Xarelto as of 09/03/21   Dysphasia    per pt pcp gave her protonix   Endometrial polyp    GERD (gastroesophageal reflux disease)    History of COVID-19 12/02/2020   positive result in  care everywhere;   per pt moderate symptoms , like bronchitis,  pt states only occasional resdiual   History of TIA (transient ischemic attack)    per pt in mid to late age 7657s, told possible tia but imgaing was negative , symptom was vertigo which resolved , none issue since   Hypertension    OA (osteoarthritis)    knees   PMB (postmenopausal bleeding) 2021   Pre-diabetes    Rotator cuff tear, right    w/ shoulder pain   Wears glasses     Tobacco History: Social History   Tobacco Use  Smoking Status Former   Years: 40.00   Types: Cigarettes   Quit date: 04/14/2020   Years since quitting: 2.0  Smokeless Tobacco Never   Counseling given: Not Answered   Outpatient Medications Prior to Visit  Medication Sig Dispense Refill   albuterol (VENTOLIN HFA) 108 (90 Base) MCG/ACT inhaler Inhale 1-2 puffs into the lungs every 4 (four) hours as needed.     cetirizine (ZYRTEC) 10 MG tablet Take 10 mg by mouth daily.     fluticasone-salmeterol (WIXELA INHUB) 250-50 MCG/ACT AEPB Inhale 1 puff into the lungs in the morning and at bedtime.     Cholecalciferol (D3) 50 MCG (2000 UT) TABS      Iron, Ferrous Sulfate, 325 (65 Fe) MG TABS      lisinopril-hydrochlorothiazide (PRINZIDE,ZESTORETIC) 20-12.5 MG tablet Take 1 tablet by mouth daily.  12   pantoprazole (PROTONIX) 40 MG tablet 1 tablet  BREO ELLIPTA 200-25 MCG/INH AEPB Inhale 1 puff into the lungs at bedtime.     XARELTO 20 MG TABS tablet Take 20 mg by mouth daily.     No facility-administered medications prior to visit.   Review of Systems  Review of Systems  Constitutional: Negative.   HENT: Negative.    Respiratory:  Positive for cough and chest tightness. Negative for wheezing.    Physical Exam  BP 120/70 (BP Location: Left Arm)   Pulse 80   Temp 97.8 F (36.6 C)   Ht '5\' 4"'$  (1.626 m)   Wt 238 lb 3.2 oz (108 kg)   SpO2 98%   BMI 40.89 kg/m  Physical Exam Constitutional:      Appearance: Normal appearance.  HENT:      Head: Normocephalic and atraumatic.     Mouth/Throat:     Mouth: Mucous membranes are moist.     Pharynx: Oropharynx is clear.  Cardiovascular:     Rate and Rhythm: Normal rate and regular rhythm.  Pulmonary:     Effort: Pulmonary effort is normal.     Breath sounds: Normal breath sounds. No wheezing, rhonchi or rales.     Comments: CTA Musculoskeletal:        General: Normal range of motion.  Skin:    General: Skin is warm and dry.  Neurological:     General: No focal deficit present.     Mental Status: She is alert and oriented to person, place, and time. Mental status is at baseline.  Psychiatric:        Mood and Affect: Mood normal.        Behavior: Behavior normal.        Thought Content: Thought content normal.        Judgment: Judgment normal.      Lab Results:  CBC    Component Value Date/Time   WBC 12.1 (H) 09/11/2021 1836   RBC 4.37 09/11/2021 1836   HGB 11.3 (L) 09/11/2021 1836   HCT 36.0 09/11/2021 1836   PLT 319 09/11/2021 1836   MCV 82.4 09/11/2021 1836   MCH 25.9 (L) 09/11/2021 1836   MCHC 31.4 09/11/2021 1836   RDW 14.5 09/11/2021 1836   LYMPHSABS 2.7 11/05/2017 1337   MONOABS 0.7 11/05/2017 1337   EOSABS 0.4 11/05/2017 1337   BASOSABS 0.1 11/05/2017 1337    BMET    Component Value Date/Time   NA 140 09/09/2021 1508   K 3.9 09/09/2021 1508   CL 107 09/09/2021 1508   CO2 26 09/09/2021 1508   GLUCOSE 83 09/09/2021 1508   BUN 10 09/09/2021 1508   CREATININE 0.82 09/11/2021 1836   CALCIUM 9.8 09/09/2021 1508   GFRNONAA >60 09/11/2021 1836   GFRAA >60 11/05/2017 1337    BNP    Component Value Date/Time   BNP 15.8 11/05/2017 1337    ProBNP No results found for: "PROBNP"  Imaging: No results found.   Assessment & Plan:   Asthma - Increased asthma symptoms including chest tightness and dry cough last several days. Currently on Wixella 250-58mg one puff twice daily. She has eosinophilia on past labs. Recommend checking cbc with diff.  Sending in prednisone taper and starting patient on montelukast. She has allergy listed to leukotriene inhibitor but patient is unsure if medication was the cause. Advised patient if she develops itching symptoms on medication to stop taking and notify office. Recommend she try Delsym cough syrup twice daily for cough.   Recommendations  Start  Delsym cough syrup twice daily for cough until better  Start Montelukast (Singulair) '10mg'$  daily at bedtime - if you develop worsening itching discontinue and notify office Take prednisone taper as directed  Continue Wixella 250-82mg one puff twice daily  Use Albuterol inhaler 2 puffs every 4-6 hours as needed for breakthrough shortness of breath/wheezing  Orders: CBC with diff   RX: Prednisone Montelukast (Singulair)  Follow-up: 1 month with Beth NP    EMartyn Ehrich NP 05/06/2022

## 2022-05-06 NOTE — Patient Instructions (Addendum)
Recommendations: Start Delsym cough syrup twice daily for cough until better  Start Montelukast (Singulair) '10mg'$  daily at bedtime - if you develop worsening itching discontinue and notify office Take prednisone taper as directed  Continue Wixella 250-74mg one puff twice daily  Use Albuterol inhaler 2 puffs every 4-6 hours as needed for breakthrough shortness of breath/wheezing  Orders: CBC with diff   RX: Prednisone Montelukast (Singulair)  Follow-up: 1 month with Beth NP    Montelukast Tablets What is this medication? MONTELUKAST (mon te LOO kast) prevents and treats the symptoms of asthma and allergies. It works by decreasing inflammation in the airways, making it easier to breathe. Do not use this medication to treat a sudden asthma attack. This medicine may be used for other purposes; ask your health care provider or pharmacist if you have questions. COMMON BRAND NAME(S): Singulair What should I tell my care team before I take this medication? They need to know if you have any of these conditions: Liver disease An unusual or allergic reaction to montelukast, other medications, foods, dyes, or preservatives Pregnant or trying to get pregnant Breastfeeding How should I use this medication? Take this medication by mouth with water. Take it as directed on the prescription label at the same time every day. You can take this medication with or without food. If it upsets your stomach, take it with food. Keep taking it unless your care team tells you to stop. A special MedGuide will be given to you by the pharmacist with each prescription and refill. Be sure to read this information carefully each time. Talk to your care team about the use of this medication in children. While this medication may be prescribed for children as young as 15 years for selected conditions, precautions do apply. Overdosage: If you think you have taken too much of this medicine contact a poison control center  or emergency room at once. NOTE: This medicine is only for you. Do not share this medicine with others. What if I miss a dose? If you miss a dose, skip it. Take your next dose at the normal time. Do not take extra or 2 doses at the same time to make up for the missed dose. What may interact with this medication? Medications for seizures, such as phenytoin, phenobarbital, carbamazepine Rifabutin Rifampin This list may not describe all possible interactions. Give your health care provider a list of all the medicines, herbs, non-prescription drugs, or dietary supplements you use. Also tell them if you smoke, drink alcohol, or use illegal drugs. Some items may interact with your medicine. What should I watch for while using this medication? Visit your care team for regular checks on your progress. Tell your care team if your allergy or asthma symptoms do not improve. Take your medication even when you do not have symptoms. If you have asthma, talk to your care team about what to do in an acute asthma attack. Always have your rescue medication for asthma attacks with you. This medication may cause thoughts of suicide or depression. This includes sudden changes in mood, behaviors, or thoughts. Also watch for sudden changes in feelings and behaviors, such as feeling anxious, agitated, panicky, irritable, hostile, aggressive, impulsive, severely restless, overly excited and hyperactive, or not being able to sleep. Call your care team right away if you experience a change in behavior or worsening depression. What side effects may I notice from receiving this medication? Side effects that you should report to your care team as soon as  possible: Allergic reactions--skin rash, itching, hives, swelling of the face, lips, tongue, or throat Flu-like symptoms--fever, chills, muscle pain, cough, headache, fatigue Mood and behavior changes such as anxiety, nervousness, confusion, hallucinations, irritability,  hostility, thoughts of suicide or self-harm, worsening mood, feelings of depression Pain, tingling, or numbness in the hands or feet Sinus pain or pressure around the face or forehead Trouble sleeping Vivid dreams or nightmares Side effects that usually do not require medical attention (report to your care team if they continue or are bothersome): Cough Diarrhea Headache Runny or stuffy nose Sore throat Stomach pain This list may not describe all possible side effects. Call your doctor for medical advice about side effects. You may report side effects to FDA at 1-800-FDA-1088. Where should I keep my medication? Keep out of the reach of children and pets. Store at room temperature between 15 and 30 degrees C (59 and 86 degrees F). Protect from light and moisture. Protect from light and moisture. Keep the container tightly closed. Get rid of any unused medication after the expiration date. To get rid of medications that are no longer needed or expired: Take the medication to a medication take-back program. Check with your pharmacy or law enforcement to find a location. If you cannot return the medication, check the label or package insert to see if the medication should be thrown out in the garbage or flushed down the toilet. If you are not sure, ask your care team. If it is safe to put in the trash, empty the medication out of the container. Mix the medication with cat litter, dirt, coffee grounds, or other unwanted substance. Seal the mixture in a bag or container. Put it in the trash. NOTE: This sheet is a summary. It may not cover all possible information. If you have questions about this medicine, talk to your doctor, pharmacist, or health care provider.  2023 Elsevier/Gold Standard (2020-03-09 00:00:00)

## 2022-05-06 NOTE — Assessment & Plan Note (Addendum)
-  Increased asthma symptoms including chest tightness and dry cough last several days. Currently on Wixella 250-77mg one puff twice daily. She has eosinophilia on past labs. Recommend checking cbc with diff. Sending in prednisone taper and starting patient on montelukast. She has allergy listed to leukotriene inhibitor but patient is unsure if medication was the cause. Advised patient if she develops itching symptoms on medication to stop taking and notify office. Recommend she try Delsym cough syrup twice daily for cough.   Recommendations  Start Delsym cough syrup twice daily for cough until better  Start Montelukast (Singulair) '10mg'$  daily at bedtime - if you develop worsening itching discontinue and notify office Take prednisone taper as directed  Continue Wixella 250-510m one puff twice daily  Use Albuterol inhaler 2 puffs every 4-6 hours as needed for breakthrough shortness of breath/wheezing  Orders: CBC with diff   RX: Prednisone Montelukast (Singulair)  Follow-up: 1 month with BeEustaquio MaizeP

## 2022-05-07 LAB — CBC WITH DIFFERENTIAL/PLATELET
Basophils Absolute: 0 10*3/uL (ref 0.0–0.1)
Basophils Relative: 0.7 % (ref 0.0–3.0)
Eosinophils Absolute: 0.5 10*3/uL (ref 0.0–0.7)
Eosinophils Relative: 6.3 % — ABNORMAL HIGH (ref 0.0–5.0)
HCT: 37.1 % (ref 36.0–46.0)
Hemoglobin: 12 g/dL (ref 12.0–15.0)
Lymphocytes Relative: 26.5 % (ref 12.0–46.0)
Lymphs Abs: 1.9 10*3/uL (ref 0.7–4.0)
MCHC: 32.4 g/dL (ref 30.0–36.0)
MCV: 81.8 fl (ref 78.0–100.0)
Monocytes Absolute: 0.7 10*3/uL (ref 0.1–1.0)
Monocytes Relative: 9.4 % (ref 3.0–12.0)
Neutro Abs: 4.1 10*3/uL (ref 1.4–7.7)
Neutrophils Relative %: 57.1 % (ref 43.0–77.0)
Platelets: 337 10*3/uL (ref 150.0–400.0)
RBC: 4.53 Mil/uL (ref 3.87–5.11)
RDW: 15.6 % — ABNORMAL HIGH (ref 11.5–15.5)
WBC: 7.2 10*3/uL (ref 4.0–10.5)

## 2022-05-28 ENCOUNTER — Other Ambulatory Visit: Payer: Self-pay | Admitting: Primary Care

## 2022-06-04 IMAGING — CT CT ANGIO CHEST
1 of 2 series · 19 of 32 positions shown · IV contrast (APPLIED)
Comparison: Chest radiographs 11/05/2017.

CLINICAL DATA: 64-year-old female with shortness of breath for
weeks. Abnormal D-dimer. Recently stopped smoking.

EXAM:
CT ANGIOGRAPHY CHEST WITH CONTRAST
TECHNIQUE: Multidetector CT imaging of the chest was performed using the
standard protocol during bolus administration of intravenous
contrast. Multiplanar CT image reconstructions and MIPs were
obtained to evaluate the vascular anatomy.
CONTRAST:  75mL WTG5Y2-0ZI IOPAMIDOL (WTG5Y2-0ZI) INJECTION 76%

[Series 5: thins 1.0 b31s · axial · 0.76mm/px · z∈[-363,-90]mm · 19 of 305 slices shown]
[im 16/305  lung]
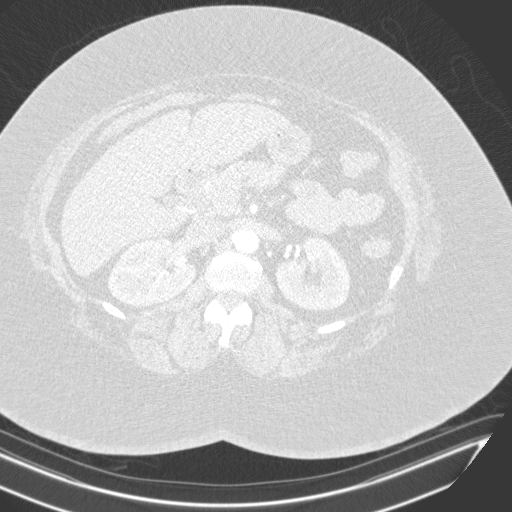
[im 31/305  mediastinal]
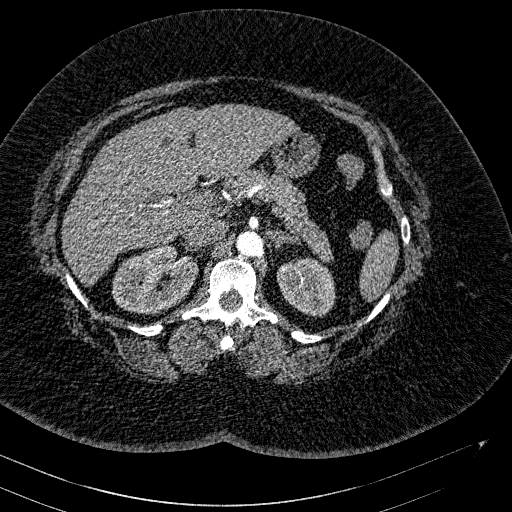
[im 46/305  lung]
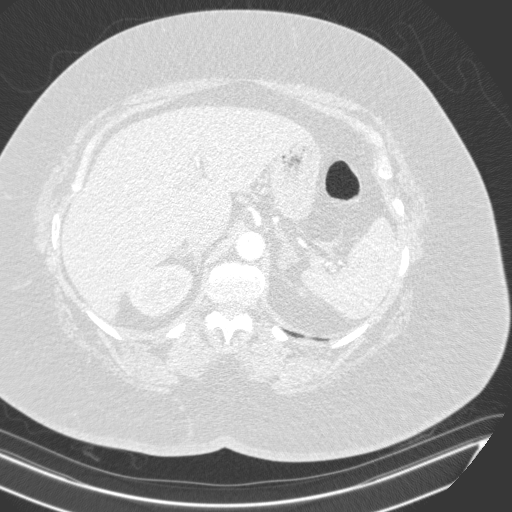
[im 77/305  mediastinal]
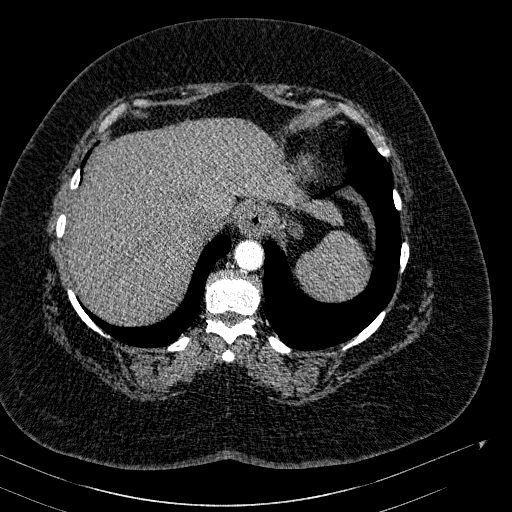
[im 92/305  lung]
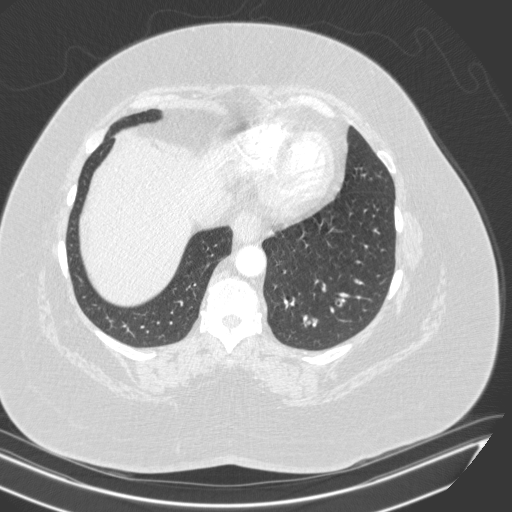
[im 102/305  mediastinal]
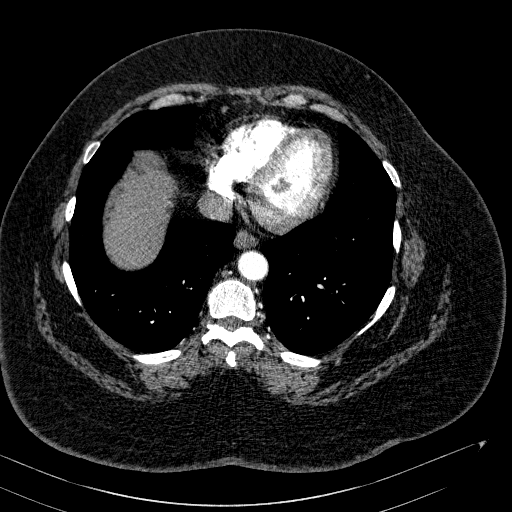
[im 107/305  lung]
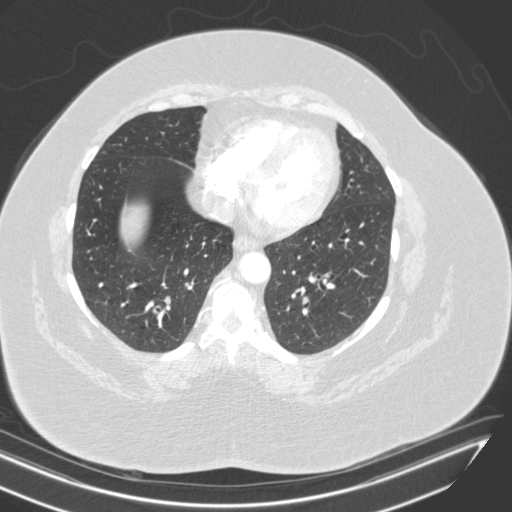
[im 122/305  mediastinal]
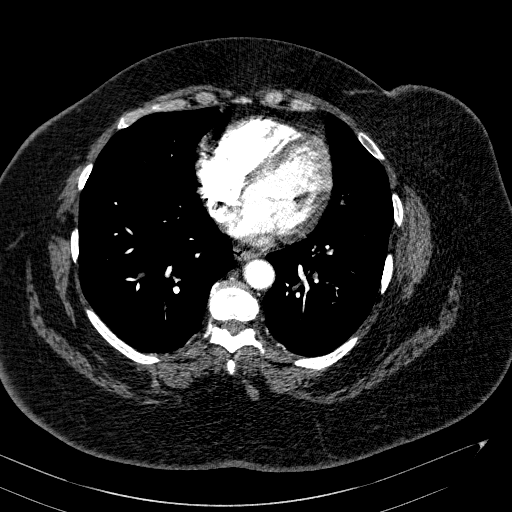
[im 137/305  lung]
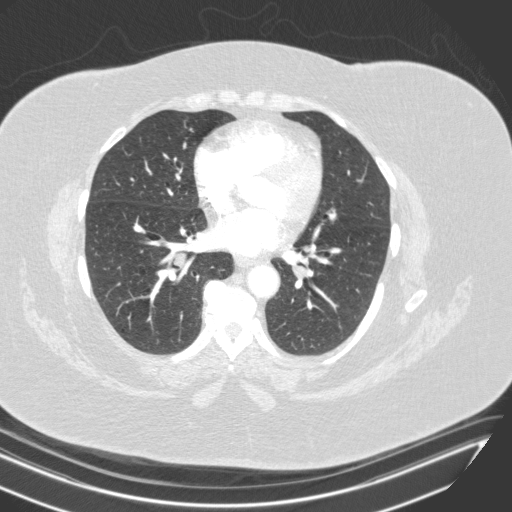
[im 153/305  mediastinal]
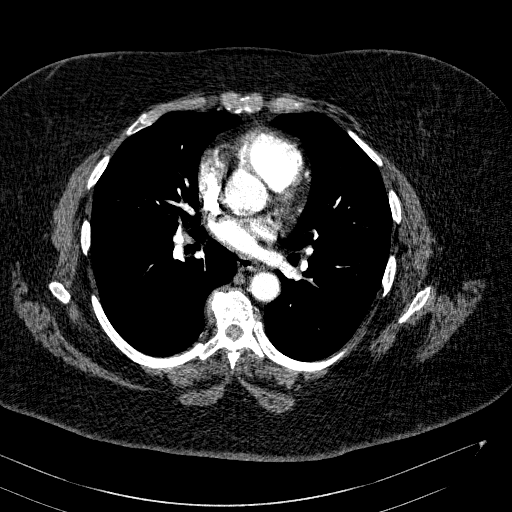
[im 168/305  lung]
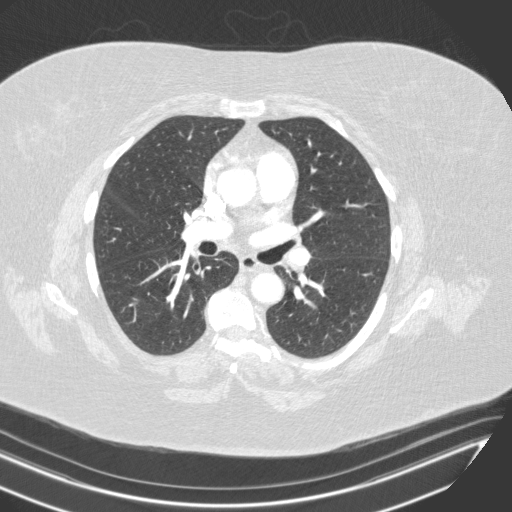
[im 183/305  mediastinal]
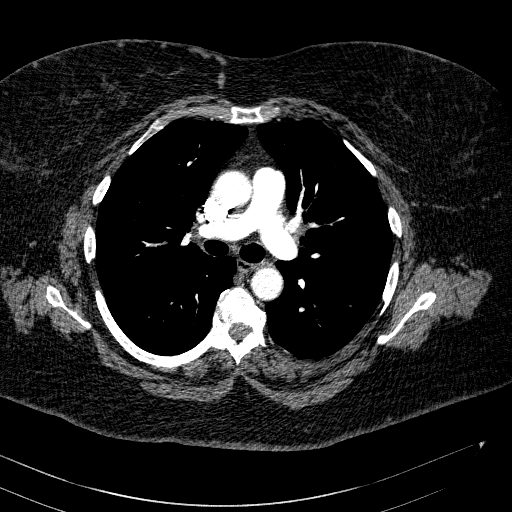
[im 198/305  lung]
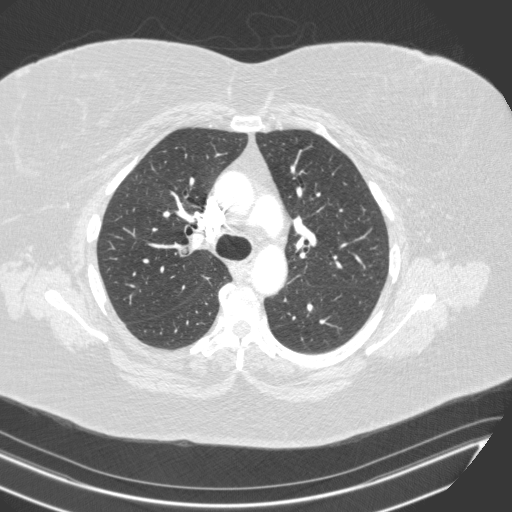
[im 203/305  mediastinal]
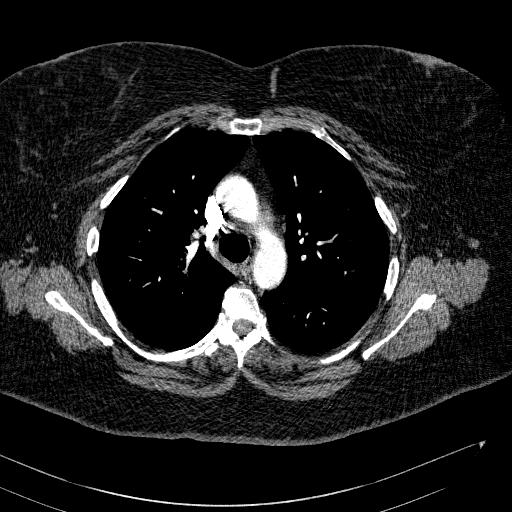
[im 213/305  lung]
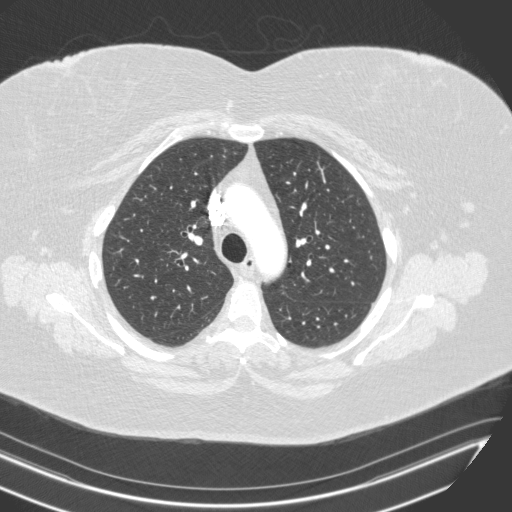
[im 229/305  mediastinal]
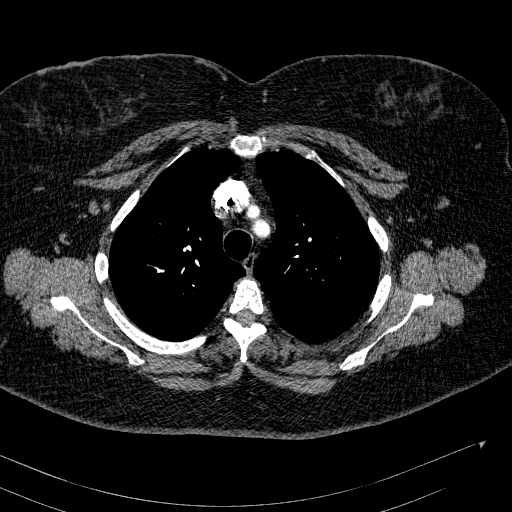
[im 259/305  lung]
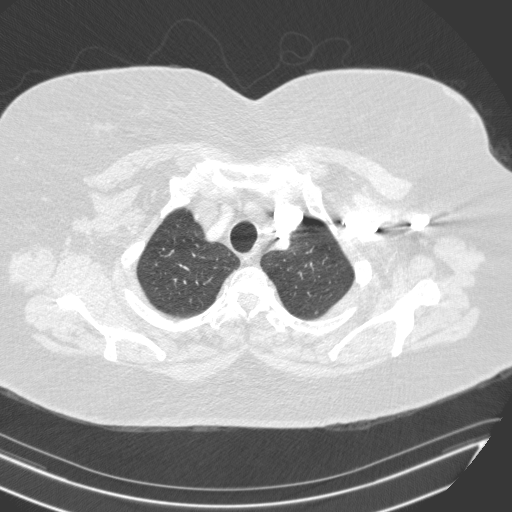
[im 274/305  mediastinal]
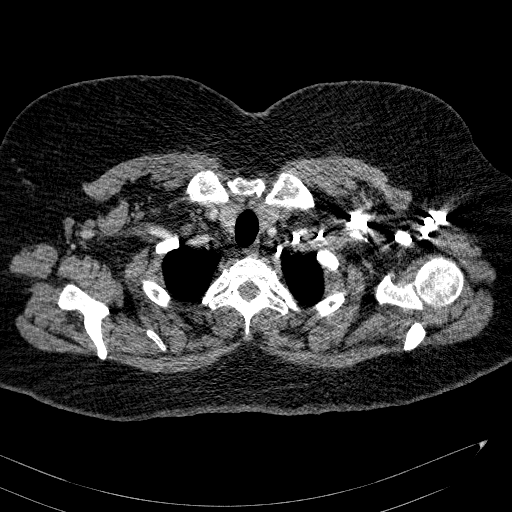
[im 289/305  lung]
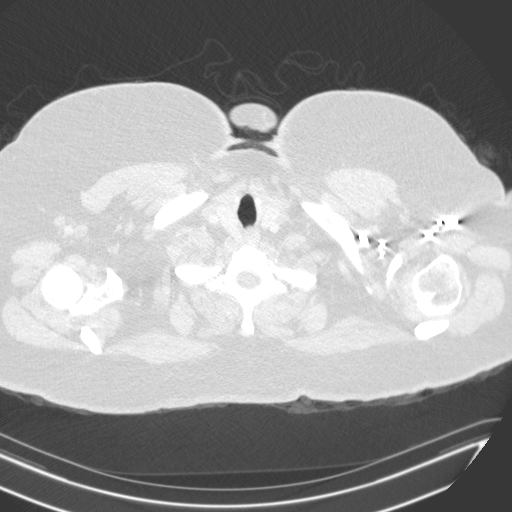

[19 of 32 positions shown; findings below may reference images not displayed]

FINDINGS: Cardiovascular: Adequate contrast bolus timing in the pulmonary
arterial tree.

No focal filling defect identified in the pulmonary arteries to
suggest acute pulmonary embolism.

No cardiomegaly or pericardial effusion. Negative visible aorta. No
calcified coronary artery atherosclerosis is evident.

Mediastinum/Nodes: Negative.  No lymphadenopathy.

Lungs/Pleura: Trachea, carina and mainstem bronchi are patent. But
there are multiple bilateral lower lobe and middle lobe segmental
and distal airways opacified with low-density material/retained
secretions (series 7, images 82-87). But there is no superimposed
peribronchial airspace disease or pulmonary consolidation. There is
borderline to mild bronchiectasis in both lungs.

No pleural effusion.  No pulmonary nodule.

Upper Abdomen: Absent gallbladder. Small circumscribed low-density
area in the right hepatic lobe on series 4, image 92 has simple
fluid density is likely a small benign cysts. Otherwise negative
visible liver, spleen, pancreas, adrenal glands, kidneys and bowel
loops in the upper abdomen. Possible small gastric hiatal hernia.

Musculoskeletal: No acute osseous abnormality identified.
Age-appropriate thoracic spine degeneration.

Review of the MIP images confirms the above findings.
IMPRESSION: 1. Negative for acute pulmonary embolus.
2. Bilateral lower lobe > middle lobe segmental and distal airway
opacification suggesting bronchitis. There is no associated airspace
disease. There is borderline to mild generalized bronchiectasis.

## 2022-06-10 ENCOUNTER — Ambulatory Visit: Payer: No Typology Code available for payment source | Admitting: Primary Care

## 2022-06-11 ENCOUNTER — Ambulatory Visit (INDEPENDENT_AMBULATORY_CARE_PROVIDER_SITE_OTHER): Payer: No Typology Code available for payment source | Admitting: Primary Care

## 2022-06-11 ENCOUNTER — Encounter: Payer: Self-pay | Admitting: Primary Care

## 2022-06-11 VITALS — BP 112/70 | HR 88 | Temp 98.4°F | Ht 63.0 in | Wt 241.8 lb

## 2022-06-11 DIAGNOSIS — J4531 Mild persistent asthma with (acute) exacerbation: Secondary | ICD-10-CM

## 2022-06-11 DIAGNOSIS — R0602 Shortness of breath: Secondary | ICD-10-CM

## 2022-06-11 DIAGNOSIS — J309 Allergic rhinitis, unspecified: Secondary | ICD-10-CM | POA: Diagnosis not present

## 2022-06-11 NOTE — Progress Notes (Signed)
$'@Patient'B$  ID: Samantha [name], female    DOB: 1956/03/08, 67 y.o.   MRN: [id number]  Chief Complaint  Patient presents with   Follow-up    SOB today with cooking.  Cough and chest tightness improved    Referring provider: Jonathon Jordan, MD  HPI: 21 female, former smoker. Past medical history significant for hypertension, COPD/asthma, allergic rhinitis, dysphagia.  Patient of Dr. Ander Slade, last seen on 12/26/20.  Previous [facility] pulmonary encounter  05/06/2022 Patient presents today for follow-up. Hx asthma. PFTs showed mild obstructive disease. She had stopped BREO but noticed recurrence of symptoms. During her last visit it was recommended she see gastroenterology d/t dysphagia symptoms. She is currently on Wixella 237mg. She feels asthma symptoms have been worse last week. She has chest tightness and dry cough. Experiences shortness of breath with household chores.    06/11/2022 Patient presents today for 1 month follow-up. She is doing better. Wheezing resolved with prednisone. She experience an episode of shortness of breath this morning while cooking. Her husband also states that she gets out of breath while doing household chores like vacuuming. Her eosinophils were elevated on CBC.  She did not tolerate Singulair, took for two nights but had difficulty fallings asleep while taking. She takes zyrtec '10mg'$  daily and has been taking this for several years. Using wixella as directed twice daily. Has not needed albuterol. She does snore at night but sleeps well, no formal sleep testing. Consider re-addressing this after getting echocardiogram to assess dyspnea symptoms.    Allergies  Allergen Reactions   Fexofenadine Itching   Flonase [Fluticasone] Itching   Singulair [Montelukast] Itching    Immunization History  Administered Date(s) Administered   PFIZER(Purple Top)SARS-COV-2 Vaccination 04/11/2020, 05/02/2020    Past Medical History:  Diagnosis Date   Asthma    Patient has  Albuterol and Breo inhaler. [facility] [place]   Borderline glaucoma of both eyes    Bronchitis [id number]  Complication of anesthesia    per pt with cholecystectomy due to tube unable to speak for days   COP[facility] with [facility]ysema ([facility]    [facility] [place] ,  hoarse,  (02-19-2021  per pt had pcp visit 02-04-2021 told having exacerbation   DOE (dyspnea on exertion)    per pt w/ stairs, but avoids, unable long distance walking due knee pain, ok with housework   [place] ([place]   left, currently on Xarelto as of 09/03/21   Dysphasia    per pt pcp gave her protonix   Endometrial polyp    GERD (gastroesophageal reflux disease)    History of COVID-19 12/02/2020   positive result in care everywhere;   per pt moderate symptoms , like bronchitis,  pt states only occasional resdiual   History of TIA (transient ischemic attack)    per pt in mid to late age 2876s, told possible tia but imgaing was negative , symptom was vertigo which resolved , none issue since   Hypertension    OA (osteoarthritis)    knees   PMB (postmenopausal bleeding) 2021   Pre-diabetes    Rotator cuff tear, right    w/ shoulder pain   Wears glasses     Tobacco History: Social History   Tobacco Use  Smoking Status Former   Years: 40.00   Types: Cigarettes   Quit date: 04/14/2020   Years since quitting: 2.1  Smokeless Tobacco Never  Counseling given: Not Answered   Outpatient Medications Prior to Visit  Medication Sig Dispense Refill   albuterol (VENTOLIN HFA) 108 (90 Base) MCG/ACT inhaler Inhale 1-2 puffs into the lungs every 4 (four) hours as needed.     cetirizine (ZYRTEC) 10 MG tablet Take 10 mg by mouth daily.     Cholecalciferol (D3) 50 MCG (2000 UT) TABS      fluticasone-salmeterol (WIXELA INHUB) 250-50 MCG/ACT AEPB Inhale 1 puff into the lungs in the morning and at bedtime.     Iron,  Ferrous Sulfate, 325 (65 Fe) MG TABS      lisinopril-hydrochlorothiazide (PRINZIDE,ZESTORETIC) 20-12.5 MG tablet Take 1 tablet by mouth daily.  12   pantoprazole (PROTONIX) 40 MG tablet 1 tablet     montelukast (SINGULAIR) 10 MG tablet TAKE 1 TABLET BY MOUTH EVERYDAY AT BEDTIME (Patient not taking: Reported on 06/11/2022) 90 tablet 1   predniSONE (DELTASONE) 10 MG tablet 4 tabs for 2 days, then 3 tabs for 2 days, 2 tabs for 2 days, then 1 tab for 2 days, then stop (Patient not taking: Reported on 06/11/2022) 20 tablet 0   No facility-administered medications prior to visit.    Review of Systems  Review of Systems  Constitutional: Negative.   HENT: Negative.    Respiratory:  Positive for shortness of breath. Negative for cough, chest tightness and wheezing.   Cardiovascular: Negative.     Physical Exam  BP 112/70 (BP Location: Left Arm, Patient Position: Sitting, Cuff Size: Large)   Pulse 88   Temp 98.4 F (36.9 C) (Oral)   Ht '5\' 3"'$  (1.6 m)   Wt 241 lb 12.8 oz (109.7 kg)   SpO2 97%   BMI 42.83 kg/m  Physical Exam Constitutional:      General: She is not in acute distress.    Appearance: Normal appearance. She is not ill-appearing.  HENT:     Head: Normocephalic and atraumatic.     Mouth/Throat:     Mouth: Mucous membranes are moist.     Pharynx: Oropharynx is clear.  Cardiovascular:     Rate and Rhythm: Normal rate and regular rhythm.  Pulmonary:     Effort: Pulmonary effort is normal.     Breath sounds: Normal breath sounds. No wheezing, rhonchi or rales.     Comments: No wheezing  Musculoskeletal:        General: Normal range of motion.  Skin:    General: Skin is warm and dry.  Neurological:     General: No focal deficit present.     Mental Status: She is alert and oriented to person, place, and time. Mental status is at baseline.  Psychiatric:        Mood and Affect: Mood normal.        Behavior: Behavior normal.        Thought Content: Thought content normal.         Judgment: Judgment normal.      Lab Results:  CBC    Component Value Date/Time   WBC 7.2 05/06/2022 1525   RBC 4.53 05/06/2022 1525   HGB 12.0 05/06/2022 1525   HCT 37.1 05/06/2022 1525   PLT 337.0 05/06/2022 1525   MCV 81.8 05/06/2022 1525   MCH 25.9 (L) 09/11/2021 1836   MCHC 32.4 05/06/2022 1525   RDW 15.6 (H) 05/06/2022 1525   LYMPHSABS 1.9 05/06/2022 1525   MONOABS 0.7 05/06/2022 1525   EOSABS 0.5 05/06/2022 1525   BASOSABS 0.0 05/06/2022 1525  BMET    Component Value Date/Time   NA 140 09/09/2021 1508   K 3.9 09/09/2021 1508   CL 107 09/09/2021 1508   CO2 26 09/09/2021 1508   GLUCOSE 83 09/09/2021 1508   BUN 10 09/09/2021 1508   CREATININE 0.82 09/11/2021 1836   CALCIUM 9.8 09/09/2021 1508   GFRNONAA >60 09/11/2021 1836   GFRAA >60 11/05/2017 1337    BNP    Component Value Date/Time   BNP 15.8 11/05/2017 1337    ProBNP No results found for: "PROBNP"  Imaging: No results found.   Assessment & Plan:   Asthma - Treated for acute asthma exacerbation in January 2024. During that time she had increased symptoms of chest tightness, wheezing and cough. Eos absolute 500. These symptoms resolved after taking prednisone taper. She did not tolerate Singulair d/t insomnia symptoms. She still experiences occasional dyspnea with exertion. Her exam today was normal. Recommend getting update PFTs and checking echocardiogram. Consider increasing Wixella and may benefit from biologics if having recurrent exacerbations. Some of her dyspnea is likely weight related. Encourage weight loss and increasing physical exercise.   Allergic rhinitis - Recommend changing cetirizine (Zyrtec) to levocetirizine (Xyzal) '5mg'$  daily    Martyn Ehrich, NP 06/11/2022

## 2022-06-11 NOTE — Assessment & Plan Note (Signed)
-   Recommend changing cetirizine (Zyrtec) to levocetirizine (Xyzal) '5mg'$  daily

## 2022-06-11 NOTE — Patient Instructions (Addendum)
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  Allergy markers were elevated on lab work  Recommend getting updated breathing test and echocardiogram due to dyspnea symptoms with exertion   If you have continue to have recurrent asthma flares (wheezing, cough, chest tightness) recommend increasing your maintenance inhaler steriods dose  Recommendations Continue Wixella one puff morning and evening Use albuterol 2 puffs every 4-6 hours for breakthrough shortness of breath/wheezing Stop Zyrtec Start Xyzal '5mg'$  daily- over the counter  Work on weight loss and increasing regular physical exercise Consider sleep study in the future d/t snoring symptoms- discuss with Dr. Ander Slade at follow-upw    Orders: PFTs re: COPD Echocardiogram re: dyspnea on exertion   Follow-up: 3 months with Dr. Ander Slade

## 2022-06-11 NOTE — Assessment & Plan Note (Addendum)
-   Treated for acute asthma exacerbation in January 2024. During that time she had increased symptoms of chest tightness, wheezing and cough. Eos absolute 500. These symptoms resolved after taking prednisone taper. She did not tolerate Singulair d/t insomnia symptoms. She still experiences occasional dyspnea with exertion. Her exam today was normal. Recommend getting update PFTs and checking echocardiogram. Consider increasing Wixella and may benefit from biologics if having recurrent exacerbations. Some of her dyspnea is likely weight related. Encourage weight loss and increasing physical exercise.

## 2022-07-09 ENCOUNTER — Telehealth (HOSPITAL_COMMUNITY): Payer: Self-pay | Admitting: Primary Care

## 2022-07-09 NOTE — Telephone Encounter (Signed)
07/09/22 pt cancelled Echocardiogram  and will call back to reschedule/ [facility]

## 2022-07-11 ENCOUNTER — Ambulatory Visit (HOSPITAL_COMMUNITY): Payer: No Typology Code available for payment source

## 2022-09-12 DIAGNOSIS — M25511 Pain in right shoulder: Secondary | ICD-10-CM | POA: Diagnosis not present

## 2022-10-30 DIAGNOSIS — J479 Bronchiectasis, uncomplicated: Secondary | ICD-10-CM | POA: Diagnosis not present

## 2022-10-30 DIAGNOSIS — Z0000 Encounter for general adult medical examination without abnormal findings: Secondary | ICD-10-CM | POA: Diagnosis not present

## 2022-10-30 DIAGNOSIS — D649 Anemia, unspecified: Secondary | ICD-10-CM | POA: Diagnosis not present

## 2022-10-30 DIAGNOSIS — Z1211 Encounter for screening for malignant neoplasm of colon: Secondary | ICD-10-CM | POA: Diagnosis not present

## 2022-10-30 DIAGNOSIS — Z6841 Body Mass Index (BMI) 40.0 and over, adult: Secondary | ICD-10-CM | POA: Diagnosis not present

## 2022-10-30 DIAGNOSIS — Z79899 Other long term (current) drug therapy: Secondary | ICD-10-CM | POA: Diagnosis not present

## 2022-10-30 DIAGNOSIS — J449 Chronic obstructive pulmonary disease, unspecified: Secondary | ICD-10-CM | POA: Diagnosis not present

## 2022-10-30 DIAGNOSIS — Z23 Encounter for immunization: Secondary | ICD-10-CM | POA: Diagnosis not present

## 2022-10-30 DIAGNOSIS — E559 Vitamin D deficiency, unspecified: Secondary | ICD-10-CM | POA: Diagnosis not present

## 2022-10-30 DIAGNOSIS — I10 Essential (primary) hypertension: Secondary | ICD-10-CM | POA: Diagnosis not present

## 2022-10-30 DIAGNOSIS — J4520 Mild intermittent asthma, uncomplicated: Secondary | ICD-10-CM | POA: Diagnosis not present

## 2022-11-27 DIAGNOSIS — J029 Acute pharyngitis, unspecified: Secondary | ICD-10-CM | POA: Diagnosis not present

## 2022-11-27 DIAGNOSIS — R051 Acute cough: Secondary | ICD-10-CM | POA: Diagnosis not present

## 2022-11-27 DIAGNOSIS — R0981 Nasal congestion: Secondary | ICD-10-CM | POA: Diagnosis not present

## 2022-11-27 DIAGNOSIS — R5383 Other fatigue: Secondary | ICD-10-CM | POA: Diagnosis not present

## 2022-11-27 DIAGNOSIS — R6883 Chills (without fever): Secondary | ICD-10-CM | POA: Diagnosis not present

## 2022-11-27 DIAGNOSIS — U071 COVID-19: Secondary | ICD-10-CM | POA: Diagnosis not present

## 2023-01-08 DIAGNOSIS — J019 Acute sinusitis, unspecified: Secondary | ICD-10-CM | POA: Diagnosis not present

## 2023-04-03 DIAGNOSIS — J358 Other chronic diseases of tonsils and adenoids: Secondary | ICD-10-CM | POA: Diagnosis not present

## 2023-04-03 DIAGNOSIS — J029 Acute pharyngitis, unspecified: Secondary | ICD-10-CM | POA: Diagnosis not present

## 2023-06-03 IMAGING — US US EXTREM LOW VENOUS*L*
1 series · 14 of 24 positions shown · non-contrast
Comparison: Chest XR, 11/05/2017

CLINICAL DATA: LEFT lower extremity pain.

EXAM:
LEFT LOWER EXTREMITY VENOUS DOPPLER ULTRASOUND
TECHNIQUE: Gray-scale sonography with compression, as well as color and duplex
ultrasound, were performed to evaluate the deep venous system(s)
from the level of the common femoral vein through the popliteal and
proximal calf veins.

[Series 1: us venous img lower uni left (dvt) · portal-venous · 14 of 37 slices shown]
[im 1/37]
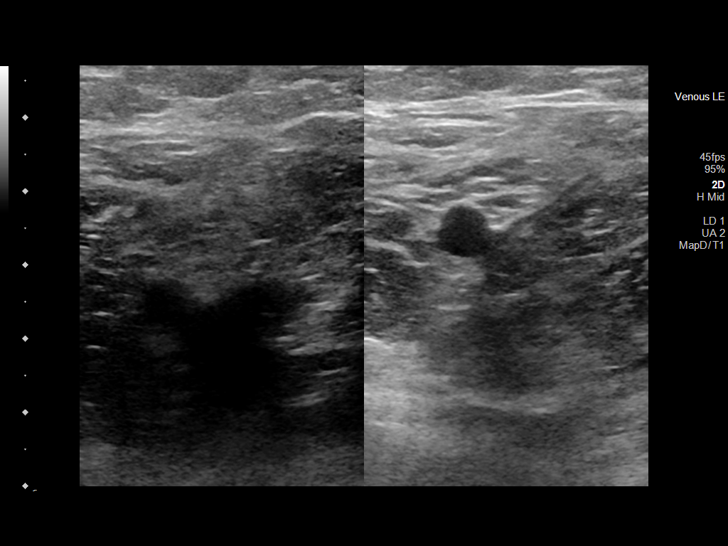
[im 4/37]
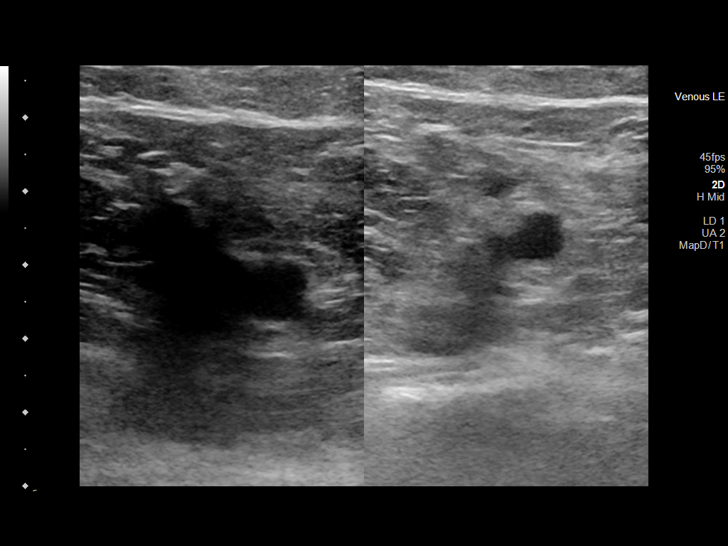
[im 7/37]
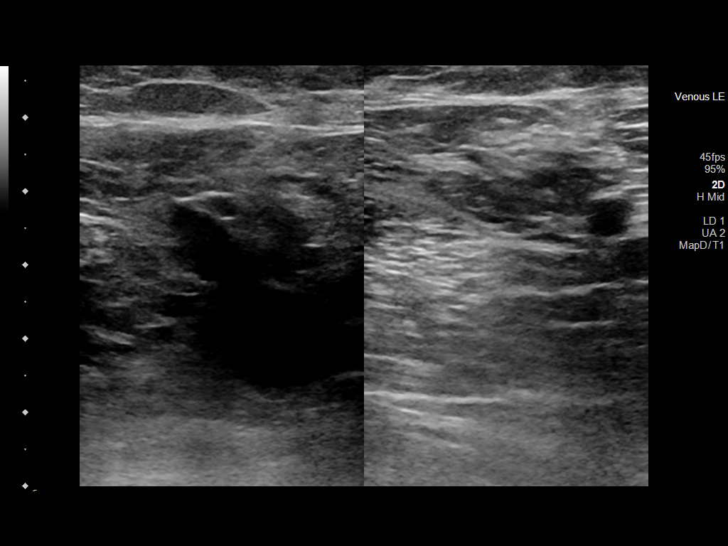
[im 10/37]
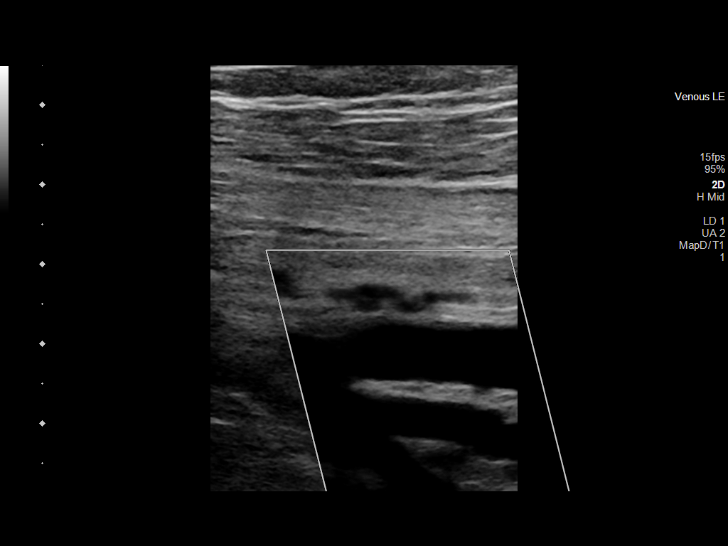
[im 11/37]
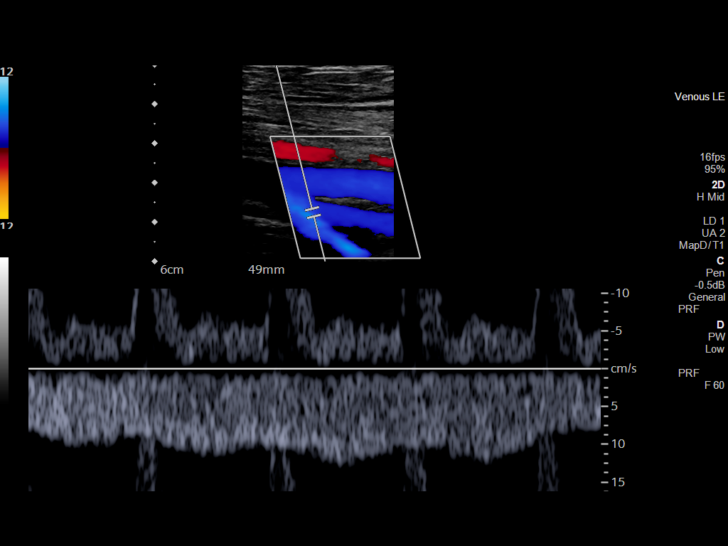
[im 15/37]
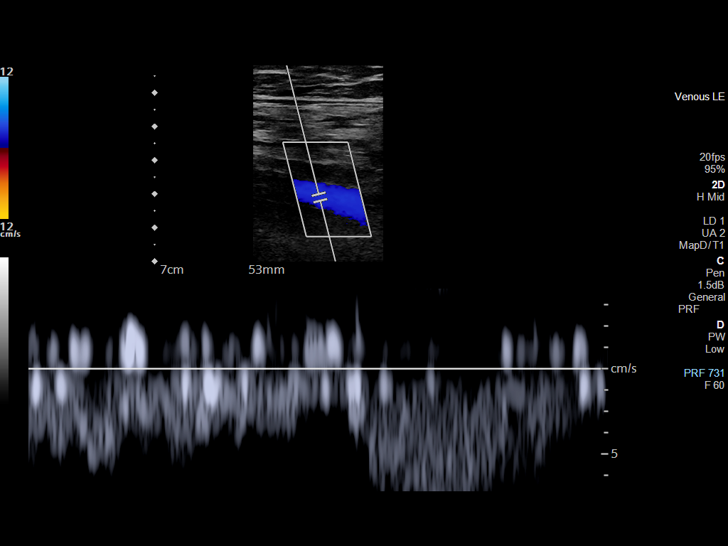
[im 18/37]
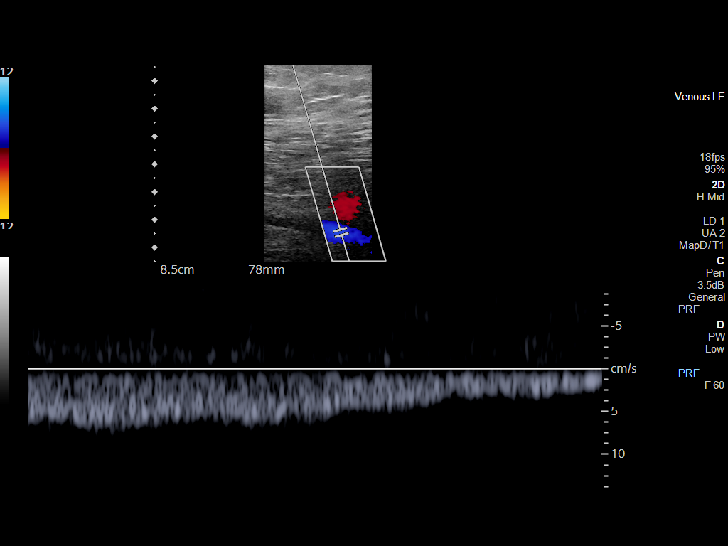
[im 19/37]
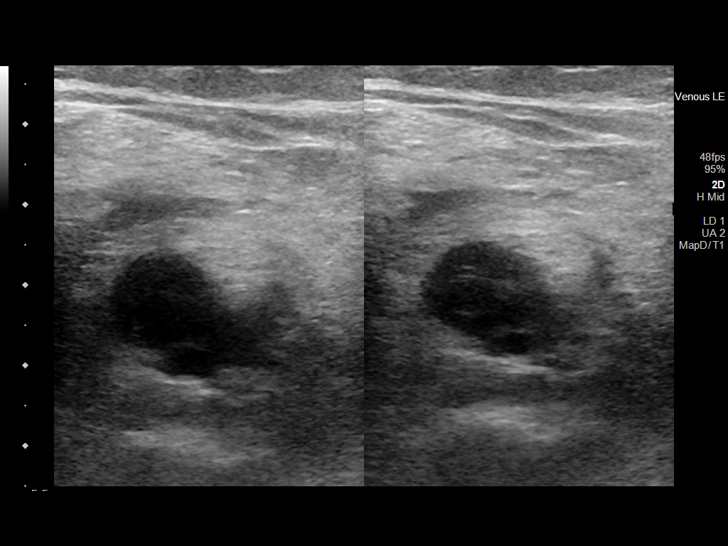
[im 22/37]
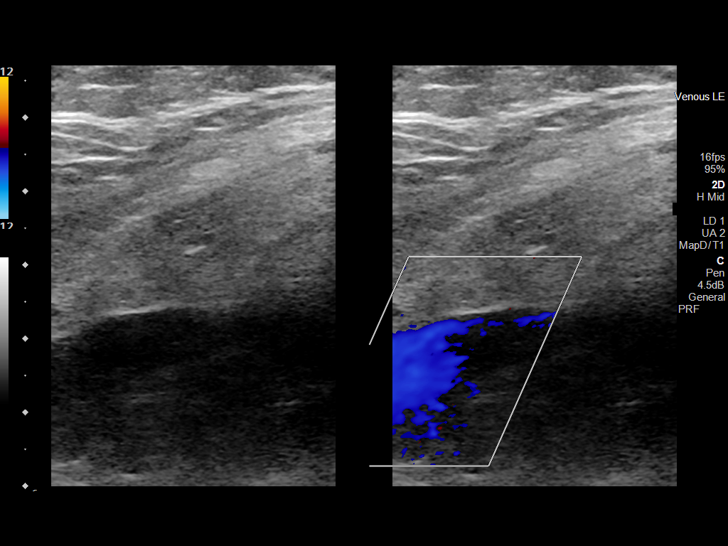
[im 26/37]
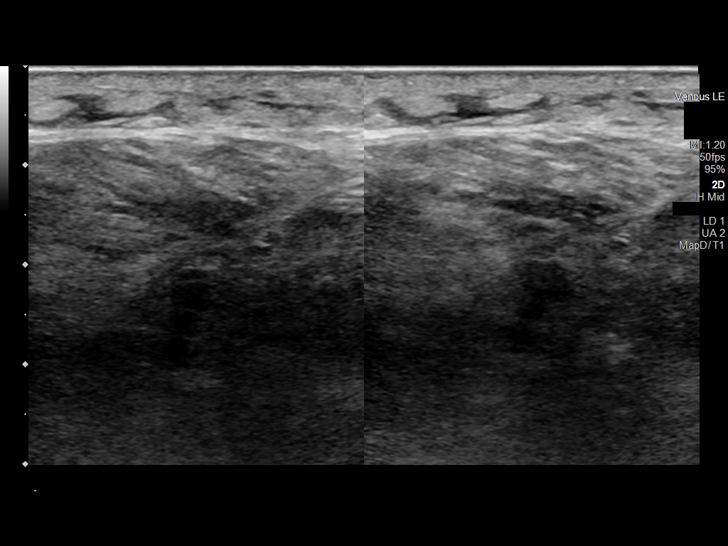
[im 29/37]
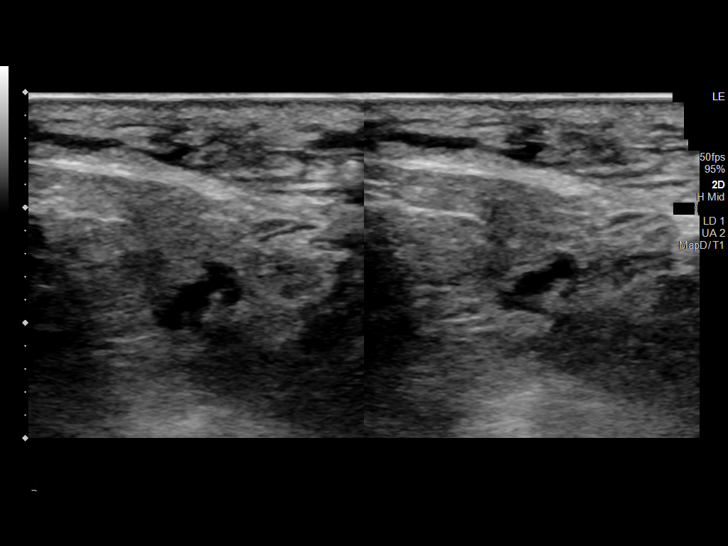
[im 30/37]
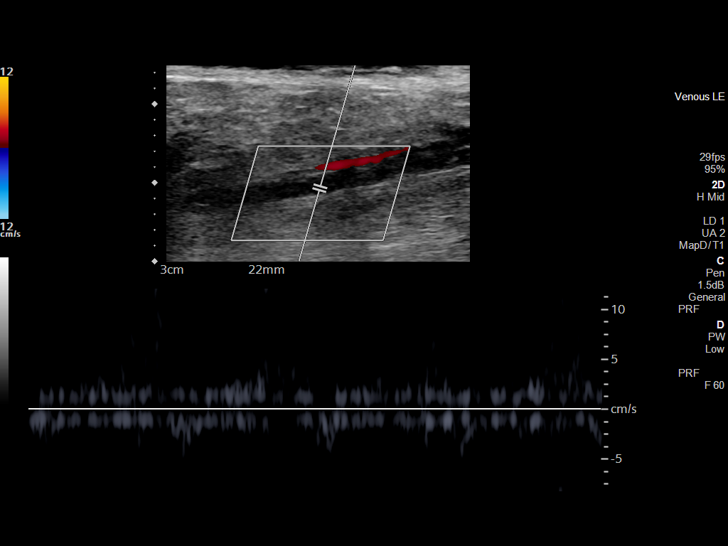
[im 33/37]
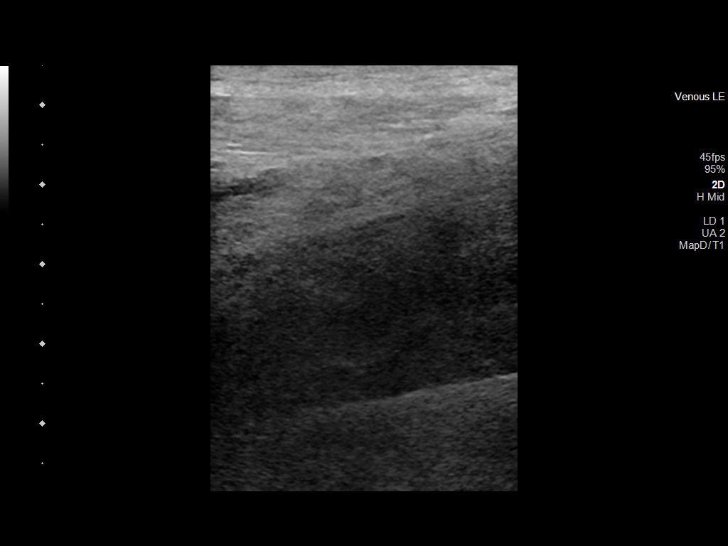
[im 37/37]
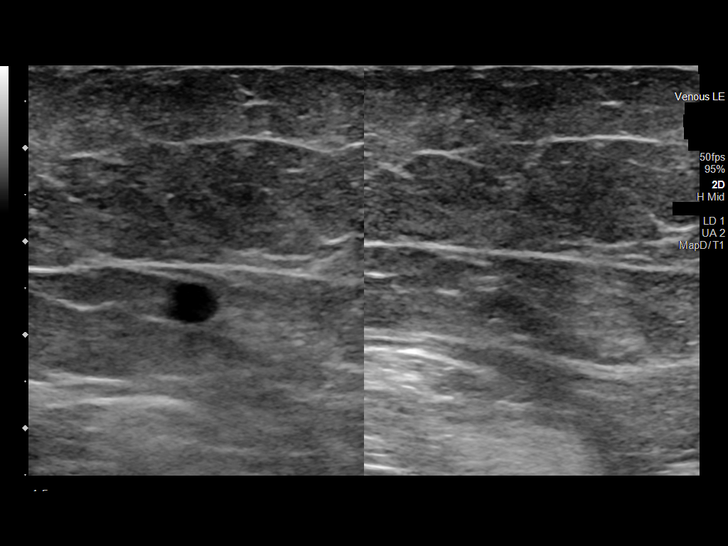

[14 of 24 positions shown; findings below may reference images not displayed]

FINDINGS: VENOUS

Normal compressibility of the common femoral and superficial femoral
veins.

Heterogeneous filling defect with noncompressibility and decreased
venous flow on Doppler is noted within the LEFT popliteal vein, and
visualized portions of the posterior tibial and peroneal veins.

Limited views of the contralateral common femoral vein are
unremarkable.

OTHER

No evidence of superficial thrombophlebitis or abnormal fluid
collection.

Limitations: none
IMPRESSION: Positive for DVT within the LEFT lower extremity involving the
popliteal, PT and peroneal veins.

These results will be called to the ordering clinician or
representative by the Radiologist Assistant, and communication
documented in the PACS or [REDACTED].

## 2023-06-10 DIAGNOSIS — R051 Acute cough: Secondary | ICD-10-CM | POA: Diagnosis not present

## 2023-06-10 DIAGNOSIS — Z03818 Encounter for observation for suspected exposure to other biological agents ruled out: Secondary | ICD-10-CM | POA: Diagnosis not present

## 2023-06-10 DIAGNOSIS — J029 Acute pharyngitis, unspecified: Secondary | ICD-10-CM | POA: Diagnosis not present

## 2023-06-10 DIAGNOSIS — I10 Essential (primary) hypertension: Secondary | ICD-10-CM | POA: Diagnosis not present

## 2023-11-03 DIAGNOSIS — R051 Acute cough: Secondary | ICD-10-CM | POA: Diagnosis not present

## 2023-11-03 DIAGNOSIS — H938X3 Other specified disorders of ear, bilateral: Secondary | ICD-10-CM | POA: Diagnosis not present
